# Patient Record
Sex: Male | Born: 1995 | Hispanic: No | Marital: Married | Smoking: Current every day smoker
Health system: Southern US, Community
[De-identification: ages and names within clinical notes are randomized; demographics above are authoritative.]

## PROBLEM LIST (undated history)

## (undated) DIAGNOSIS — I10 Essential (primary) hypertension: Secondary | ICD-10-CM

## (undated) DIAGNOSIS — Z789 Other specified health status: Secondary | ICD-10-CM

## (undated) HISTORY — PX: NO PAST SURGERIES: SHX2092

---

## 2021-07-07 ENCOUNTER — Emergency Department: Payer: Self-pay

## 2021-07-07 ENCOUNTER — Inpatient Hospital Stay
Admission: EM | Admit: 2021-07-07 | Discharge: 2021-07-11 | DRG: 200 | Disposition: A | Discharge status: Home / Self Care | Payer: Self-pay | Attending: Internal Medicine | Admitting: Internal Medicine

## 2021-07-07 ENCOUNTER — Other Ambulatory Visit: Payer: Self-pay

## 2021-07-07 ENCOUNTER — Encounter: Payer: Self-pay | Admitting: Emergency Medicine

## 2021-07-07 DIAGNOSIS — Z20822 Contact with and (suspected) exposure to covid-19: Secondary | ICD-10-CM | POA: Diagnosis present

## 2021-07-07 DIAGNOSIS — K5900 Constipation, unspecified: Secondary | ICD-10-CM

## 2021-07-07 DIAGNOSIS — R39198 Other difficulties with micturition: Secondary | ICD-10-CM

## 2021-07-07 DIAGNOSIS — F17210 Nicotine dependence, cigarettes, uncomplicated: Secondary | ICD-10-CM | POA: Diagnosis present

## 2021-07-07 DIAGNOSIS — Z23 Encounter for immunization: Secondary | ICD-10-CM

## 2021-07-07 DIAGNOSIS — D680 Von Willebrand's disease: Secondary | ICD-10-CM | POA: Diagnosis present

## 2021-07-07 DIAGNOSIS — J939 Pneumothorax, unspecified: Secondary | ICD-10-CM | POA: Diagnosis present

## 2021-07-07 DIAGNOSIS — R7301 Impaired fasting glucose: Secondary | ICD-10-CM | POA: Diagnosis present

## 2021-07-07 DIAGNOSIS — J9311 Primary spontaneous pneumothorax: Principal | ICD-10-CM | POA: Diagnosis present

## 2021-07-07 DIAGNOSIS — R011 Cardiac murmur, unspecified: Secondary | ICD-10-CM

## 2021-07-07 DIAGNOSIS — D68021 Von Willebrand disease, type 2b: Secondary | ICD-10-CM

## 2021-07-07 DIAGNOSIS — Z4682 Encounter for fitting and adjustment of non-vascular catheter: Secondary | ICD-10-CM

## 2021-07-07 DIAGNOSIS — Z720 Tobacco use: Secondary | ICD-10-CM | POA: Insufficient documentation

## 2021-07-07 DIAGNOSIS — D72829 Elevated white blood cell count, unspecified: Secondary | ICD-10-CM | POA: Insufficient documentation

## 2021-07-07 DIAGNOSIS — E876 Hypokalemia: Secondary | ICD-10-CM | POA: Diagnosis present

## 2021-07-07 HISTORY — DX: Other specified health status: Z78.9

## 2021-07-07 LAB — CBC
HCT: 39.6 % (ref 39.0–52.0)
Hemoglobin: 13.8 g/dL (ref 13.0–17.0)
MCH: 32.3 pg (ref 26.0–34.0)
MCHC: 34.8 g/dL (ref 30.0–36.0)
MCV: 92.7 fL (ref 80.0–100.0)
Platelets: 181 10*3/uL (ref 150–400)
RBC: 4.27 MIL/uL (ref 4.22–5.81)
RDW: 12.2 % (ref 11.5–15.5)
WBC: 11.3 10*3/uL — ABNORMAL HIGH (ref 4.0–10.5)
nRBC: 0 % (ref 0.0–0.2)

## 2021-07-07 LAB — BASIC METABOLIC PANEL
Anion gap: 6 (ref 5–15)
BUN: 15 mg/dL (ref 6–20)
CO2: 29 mmol/L (ref 22–32)
Calcium: 8.7 mg/dL — ABNORMAL LOW (ref 8.9–10.3)
Chloride: 101 mmol/L (ref 98–111)
Creatinine, Ser: 1.05 mg/dL (ref 0.61–1.24)
GFR, Estimated: >60 mL/min (ref >60)
Glucose, Bld: 157 mg/dL — ABNORMAL HIGH (ref 70–99)
Potassium: 3.4 mmol/L — ABNORMAL LOW (ref 3.5–5.1)
Sodium: 136 mmol/L (ref 135–145)

## 2021-07-07 LAB — TROPONIN I (HIGH SENSITIVITY): Troponin I (High Sensitivity): <2 ng/L (ref <18)

## 2021-07-07 LAB — RESP PANEL BY RT-PCR (FLU A&B, COVID) ARPGX2
Influenza A by PCR: NEGATIVE
Influenza B by PCR: NEGATIVE
SARS Coronavirus 2 by RT PCR: NEGATIVE

## 2021-07-07 MED ORDER — POTASSIUM CHLORIDE CRYS ER 20 MEQ PO TBCR
40.0000 meq | EXTENDED_RELEASE_TABLET | Freq: Once | ORAL | Status: AC
  Administered 2021-07-07: 40 meq via ORAL
  Filled 2021-07-07: qty 2

## 2021-07-07 MED ORDER — OXYCODONE HCL 5 MG PO TABS
5.0000 mg | ORAL_TABLET | ORAL | Status: DC | PRN
  Administered 2021-07-07 – 2021-07-09 (×7): 5 mg via ORAL
  Filled 2021-07-07 (×8): qty 1

## 2021-07-07 MED ORDER — LIDOCAINE HCL (PF) 1 % IJ SOLN
INTRAMUSCULAR | Status: AC
  Administered 2021-07-07: 10 mL via INTRADERMAL
  Filled 2021-07-07: qty 10

## 2021-07-07 MED ORDER — MORPHINE SULFATE (PF) 2 MG/ML IV SOLN
2.0000 mg | INTRAVENOUS | Status: DC | PRN
  Administered 2021-07-07 – 2021-07-09 (×6): 2 mg via INTRAVENOUS
  Filled 2021-07-07 (×6): qty 1

## 2021-07-07 MED ORDER — ONDANSETRON HCL 4 MG/2ML IJ SOLN
4.0000 mg | Freq: Four times a day (QID) | INTRAMUSCULAR | Status: DC | PRN
  Administered 2021-07-07: 4 mg via INTRAVENOUS
  Filled 2021-07-07 (×2): qty 2

## 2021-07-07 MED ORDER — PNEUMOCOCCAL VAC POLYVALENT 25 MCG/0.5ML IJ INJ
0.5000 mL | INJECTION | INTRAMUSCULAR | Status: AC
  Administered 2021-07-08: 0.5 mL via INTRAMUSCULAR
  Filled 2021-07-07: qty 0.5

## 2021-07-07 MED ORDER — LIDOCAINE HCL (PF) 1 % IJ SOLN
0.0000 mL | Freq: Once | INTRAMUSCULAR | Status: AC | PRN
  Filled 2021-07-07: qty 30

## 2021-07-07 MED ORDER — ACETAMINOPHEN 650 MG RE SUPP: 650.0000 mg | Freq: Four times a day (QID) | RECTAL | Status: DC | PRN

## 2021-07-07 MED ORDER — SODIUM CHLORIDE 0.9% FLUSH
10.0000 mL | Freq: Three times a day (TID) | INTRAVENOUS | Status: DC
  Administered 2021-07-07 – 2021-07-08 (×5): 10 mL

## 2021-07-07 MED ORDER — ACETAMINOPHEN 325 MG PO TABS
650.0000 mg | ORAL_TABLET | Freq: Four times a day (QID) | ORAL | Status: DC | PRN
  Administered 2021-07-07 – 2021-07-08 (×2): 650 mg via ORAL
  Filled 2021-07-07 (×2): qty 2

## 2021-07-07 MED ORDER — ONDANSETRON HCL 4 MG PO TABS: 4.0000 mg | ORAL_TABLET | Freq: Four times a day (QID) | ORAL | Status: DC | PRN

## 2021-07-07 MED ORDER — ENOXAPARIN SODIUM 40 MG/0.4ML IJ SOSY
40.0000 mg | PREFILLED_SYRINGE | INTRAMUSCULAR | Status: DC
  Administered 2021-07-08: 40 mg via SUBCUTANEOUS
  Filled 2021-07-07 (×2): qty 0.4

## 2021-07-07 MED ORDER — HYDROMORPHONE HCL 1 MG/ML IJ SOLN
0.5000 mg | Freq: Once | INTRAMUSCULAR | Status: AC
  Administered 2021-07-07: 0.5 mg via INTRAVENOUS
  Filled 2021-07-07: qty 1

## 2021-07-07 NOTE — Plan of Care (Signed)
  Problem: Education: Goal: Knowledge of General Education information will improve Description Including pain rating scale, medication(s)/side effects and non-pharmacologic comfort measures Outcome: Progressing   

## 2021-07-07 NOTE — ED Triage Notes (Signed)
Pt reports he woke up from his sleep with chest pain and shortness of breath, Pt reports pain increases with movement, when he coughs, pt reports pain radiates to rt arm and rt shoulder. Pt talks in complete sentences no distress noted.

## 2021-07-07 NOTE — ED Notes (Signed)
Informed RN bed assigned 

## 2021-07-07 NOTE — H&P (Signed)
[facility] [place] [facility]   PATIENT NAME: Wyatt Nelson    MR#:  [id number]  DATE OF BIRTH:  [birth year]  DATE OF ADMISSION:  07/07/2021  PRIMARY CARE PHYSICIAN: Pcp, No   REQUESTING/REFERRING PHYSICIAN: Dr Artis Delay  CHIEF COMPLAINT:   Chief Complaint  Patient presents with   Shortness of Breath   Chest Pain    HISTORY OF PRESENT ILLNESS:  Wyatt Nelson  is a 25 y.o. male coming in with chest pain and shortness of breath.  He was awakened from sleep with chest pain radiating up into his left shoulder associated with shortness of breath.  He was found to have a left-sided pneumothorax and ER physician placed a chest tube in.  He does smoke cigarettes 1 pack/day.  COVID test negative.  Hospitalist services were contacted for further evaluation.  PAST MEDICAL HISTORY:   Past Medical History:  Diagnosis Date   Medical history non-contributory     PAST SURGICAL HISTORY:   Past Surgical History:  Procedure Laterality Date   NO PAST SURGERIES      SOCIAL HISTORY:   Social History   Tobacco Use   Smoking status: Every Day    Packs/day: 1.00    Types: Cigarettes   Smokeless tobacco: Not on file  Substance Use Topics   Alcohol use: Not Currently    FAMILY HISTORY:   Family History  Problem Relation Age of Onset   Healthy Mother    Healthy Father     DRUG ALLERGIES:  No Known Allergies  REVIEW OF SYSTEMS:  CONSTITUTIONAL: No fever, fatigue or weakness.  Feels cold. EYES: No blurred or double vision.  EARS, NOSE, AND THROAT: No tinnitus or ear pain. No sore throat RESPIRATORY: Positive for cough and shortness of breath, no wheezing or hemoptysis.  CARDIOVASCULAR: Positive for chest pain  GASTROINTESTINAL: No nausea, vomiting, diarrhea or abdominal pain. No blood in bowel movements GENITOURINARY: No dysuria, hematuria.  ENDOCRINE: No polyuria, nocturia,  HEMATOLOGY: No anemia, easy bruising or bleeding SKIN: No rash or  lesion. MUSCULOSKELETAL: No joint pain or arthritis.   NEUROLOGIC: No tingling, numbness, weakness.  PSYCHIATRY: No anxiety or depression.   MEDICATIONS AT HOME:   Prior to Admission medications   Not on File   The patient does not take any medications  VITAL SIGNS:  Blood pressure 109/62, pulse 73, temperature 98 F (36.7 C), temperature source Oral, resp. rate 15, height 5\' 7"  (1.702 m), weight 65.8 kg, SpO2 96 %.  PHYSICAL EXAMINATION:  GENERAL:  25 y.o.-year-old patient lying in the bed with no acute distress.  EYES: Pupils equal, round, reactive to light and accommodation. No scleral icterus.  HEENT: Head atraumatic, normocephalic. Oropharynx and nasopharynx clear.  NECK:  Supple, no jugular venous distention. No thyroid enlargement, no tenderness.  LUNGS: Decreased breath sounds left base, no wheezing, rales,rhonchi or crepitation. No use of accessory muscles of respiration.  CARDIOVASCULAR: S1, S2 normal. No murmurs, rubs, or gallops.  ABDOMEN: Soft, nontender, nondistended. Bowel sounds present.  EXTREMITIES: No pedal edema, cyanosis, or clubbing.  NEUROLOGIC: Cranial nerves II through XII are intact. Muscle strength 5/5 in all extremities. Sensation intact. Gait not checked.  PSYCHIATRIC: The patient is alert and oriented x 3.  SKIN: No rash, lesion, or ulcer.   LABORATORY PANEL:   CBC Recent Labs  Lab 07/07/21 0616  WBC 11.3*  HGB 13.8  HCT 39.6  PLT 181   ------------------------------------------------------------------------------------------------------------------  Chemistries  Recent Labs  Lab 07/07/21 [phone]  NA 136  K 3.4*  CL 101  CO2 29  GLUCOSE 157*  BUN 15  CREATININE 1.05  CALCIUM 8.7*   ------------------------------------------------------------------------------------------------------------------   RADIOLOGY:  DG Chest 2 View  Result Date: 07/07/2021 CLINICAL DATA:  Chest pain and shortness of breath. EXAM: CHEST - 2 VIEW  COMPARISON:  None. FINDINGS: Midline trachea. Normal heart size and mediastinal contours. No pleural fluid. A large, approximately 50% left-sided pneumothorax identified superiorly and laterally. No mediastinal shift. Clear lungs. IMPRESSION: Large left-sided pneumothorax. Critical test results telephoned to .Dr. Elesa Massed. At the time of interpretation at 7:16 a.m.On 07/07/2021. Electronically Signed   By: Jeronimo Greaves M.D.   On: 07/07/2021 07:18   DG Chest Portable 1 View  Result Date: 07/07/2021 CLINICAL DATA:  Left-sided pneumothorax status post chest tube placement. EXAM: PORTABLE CHEST 1 VIEW COMPARISON:  Chest radiograph performed the same day. FINDINGS: The heart size and mediastinal contours are within normal limits. There has been interval placement of a left-sided thoracostomy tube with tip overlying the left mid lung. The left-sided pneumothorax has significantly decreased in size, now small in volume. There is mild left midlung atelectasis. The right lung is clear. There is no pleural effusion or right pneumothorax. The visualized skeletal structures are unremarkable. IMPRESSION: Interval decreased size of a left pneumothorax after chest tube placement, now small in volume. Electronically Signed   By: Romona Curls M.D.   On: 07/07/2021 09:22    EKG:   Normal sinus rhythm 70 bpm RSR prime.  IMPRESSION AND PLAN:   1.  Spontaneous pneumothorax in a smoker.  Chest tube placement by ER physician.  General surgery team to manage chest tube.  Will likely need a CT scan of the chest for further evaluation of the lungs during the hospital stay.  As needed oral and IV pain medication 2.  Tobacco abuse.  Refused nicotine patch. 3.  Leukocytosis likely secondary to pneumothorax 4.  Impaired fasting glucose check a hemoglobin A1c 5.  Hypokalemia.  Replace potassium orally 6.  Check Lyme titer    All the records, laboratory and radiological are reviewed and case discussed with ED provider. Management  plans discussed with the patient, family and they are in agreement.  Patient relates and creation criteria with pneumothorax and chest tube placement.  CODE STATUS: Full code  TOTAL TIME TAKING CARE OF THIS PATIENT: 45 minutes.    Alford Highland M.D on 07/07/2021 at 3:38 PM  Between 7am to 6pm - Pager - [phone]  After 6pm call admission pager [phone]  [facility]  CC: Primary care physician; Pcp, No

## 2021-07-07 NOTE — ED Provider Notes (Signed)
[facility] Emergency Department Provider Note  ____________________________________________   Event Date/Time   First MD Initiated Contact with Patient 07/07/21 [phone]     (approximate)  I have reviewed the triage vital signs and the nursing notes.   HISTORY  Chief Complaint Shortness of Breath and Chest Pain    HPI Wyatt Nelson is a 25 y.o. male otherwise healthy who comes in with chest pain.  Patient states that he woke up from sleep with chest pain and shortness of breath.  Reports pain is increased with movement when he coughs and the pain radiates into his right arm and right shoulder.  The pain is constant, has not take anything to help the pain.  Patient denies a history of this happening before.  Does smoke cigarettes for many years    History reviewed. No pertinent past medical history.  There are no problems to display for this patient.   History reviewed. No pertinent surgical history.  Prior to Admission medications   Not on File    Allergies Patient has no allergy information on record.  No family history on file.  Social History Positive smoking, Pt is married- speaks Arabic but declines Nurse, learning disability.  Wife is at bedside who speaks [place].    Review of Systems Constitutional: No fever/chills Eyes: No visual changes. ENT: No sore throat. Cardiovascular: Positive chest pain Respiratory: Positive shortness of breath Gastrointestinal: No abdominal pain.  No nausea, no vomiting.  No diarrhea.  No constipation. Genitourinary: Negative for dysuria. Musculoskeletal: Negative for back pain. Skin: Negative for rash. Neurological: Negative for headaches, focal weakness or numbness. All other ROS negative ____________________________________________   PHYSICAL EXAM:  VITAL SIGNS: ED Triage Vitals  Enc Vitals Group     BP 07/07/21 0608 (!) 103/53     Pulse Rate 07/07/21 0608 74     Resp 07/07/21 0608 20     Temp 07/07/21  0608 98 F (36.7 C)     Temp Source 07/07/21 0608 Oral     SpO2 07/07/21 0608 98 %     Weight 07/07/21 0611 145 lb (65.8 kg)     Height 07/07/21 0611 5\' 7"  (1.702 m)     Head Circumference --      Peak Flow --      Pain Score 07/07/21 0611 10     Pain Loc --      Pain Edu? --      Excl. in GC? --     Constitutional: Alert and oriented. Well appearing and in no acute distress. Eyes: Conjunctivae are normal. EOMI. Head: Atraumatic. Nose: No congestion/rhinnorhea. Mouth/Throat: Mucous membranes are moist.   Neck: No stridor. Trachea Midline. FROM Cardiovascular: Normal rate, regular rhythm. Grossly normal heart sounds.  Good peripheral circulation. Respiratory: Normal respiratory effort.  No retractions.  Decreased breath sounds on the left Gastrointestinal: Soft and nontender. No distention. No abdominal bruits.  Musculoskeletal: No lower extremity tenderness nor edema.  No joint effusions. Neurologic:  Normal speech and language. No gross focal neurologic deficits are appreciated.  Skin:  Skin is warm, dry and intact. No rash noted. Psychiatric: Mood and affect are normal. Speech and behavior are normal. GU: Deferred   ____________________________________________   LABS (all labs ordered are listed, but only abnormal results are displayed)  Labs Reviewed  BASIC METABOLIC PANEL - Abnormal; Notable for the following components:      Result Value   Potassium 3.4 (*)    Glucose, Bld 157 (*)  Calcium 8.7 (*)    All other components within normal limits  CBC - Abnormal; Notable for the following components:   WBC 11.3 (*)    All other components within normal limits  TROPONIN I (HIGH SENSITIVITY)   ____________________________________________   ED ECG REPORT I, Concha Se, the attending physician, personally viewed and interpreted this ECG.  Normal sinus rate of 70, no ST elevations, no T wave inversions, normal  intervals ____________________________________________  RADIOLOGY Vela Prose, personally viewed and evaluated these images (plain radiographs) as part of my medical decision making, as well as reviewing the written report by the radiologist.  ED MD interpretation: Large left-sided pneumothorax  Official radiology report(s): DG Chest 2 View  Result Date: 07/07/2021 CLINICAL DATA:  Chest pain and shortness of breath. EXAM: CHEST - 2 VIEW COMPARISON:  None. FINDINGS: Midline trachea. Normal heart size and mediastinal contours. No pleural fluid. A large, approximately 50% left-sided pneumothorax identified superiorly and laterally. No mediastinal shift. Clear lungs. IMPRESSION: Large left-sided pneumothorax. Critical test results telephoned to .Dr. Elesa Massed. At the time of interpretation at 7:16 a.m.On 07/07/2021. Electronically Signed   By: Jeronimo Greaves M.D.   On: 07/07/2021 07:18    ____________________________________________   PROCEDURES  Procedure(s) performed (including Critical Care):  .1-3 Lead EKG Interpretation  Date/Time: 07/07/2021 7:36 AM Performed by: Concha Se, MD Authorized by: Concha Se, MD     Interpretation: normal     ECG rate:  70s   ECG rate assessment: normal     Rhythm: sinus rhythm     Ectopy: none     Conduction: normal   CHEST TUBE INSERTION  Date/Time: 07/07/2021 9:16 AM Performed by: Concha Se, MD Authorized by: Concha Se, MD   Consent:    Consent obtained:  Verbal and written   Consent given by:  Patient and spouse   Risks, benefits, and alternatives were discussed: yes     Risks discussed:  Bleeding, damage to surrounding structures, infection, incomplete drainage, nerve damage and pain   Alternatives discussed:  No treatment Universal protocol:    Patient identity confirmed:  Verbally with patient and arm band Pre-procedure details:    Skin preparation:  Povidone-iodine Sedation:    Sedation type:  None Anesthesia:     Anesthesia method:  Local infiltration   Local anesthetic:  Lidocaine 1% w/o epi Procedure details:    Placement location:  L lateral   Scalpel size:  11   Tube size (Fr):  Minicatheter   Tube connected to:  Suction   Drainage characteristics:  Air only   Dressing:  4x4 sterile gauze and Xeroform gauze Post-procedure details:    Post-insertion x-ray findings: tube in good position     Procedure completion:  Tolerated well, no immediate complications   ____________________________________________   INITIAL IMPRESSION / ASSESSMENT AND PLAN / ED COURSE   Cherene Julian was evaluated in Emergency Department on 07/07/2021 for the symptoms described in the history of present illness. He was evaluated in the context of the global COVID-19 pandemic, which necessitated consideration that the patient might be at risk for infection with the SARS-CoV-2 virus that causes COVID-19. Institutional protocols and algorithms that pertain to the evaluation of patients at risk for COVID-19 are in a state of rapid change based on information released by regulatory bodies including the CDC and federal and state organizations. These policies and algorithms were followed during the patient's care in the ED.    Most Likely  DDx:  -MSK (atypical chest pain) but will get cardiac markers to evaluate for ACS given risk factors/age   DDx that was also considered d/t potential to cause harm, but was found less likely based on history and physical (as detailed above): -PNA (no fevers, cough but CXR to evaluate) -PNX (reassured with equal b/l breath sounds, CXR to evaluate) -Symptomatic anemia (will get H&H) -Pulmonary embolism as no sob at rest, not pleuritic in nature, no hypoxia -Aortic Dissection as no tearing pain and no radiation to the mid back, pulses equal -Pericarditis no rub on exam, EKG changes or hx to suggest dx -Tamponade (no notable SOB, tachycardic, hypotensive) -Esophageal rupture (no h/o diffuse  vomitting/no crepitus)   Patient has large pneumothorax.  Although patient does have large pneumothorax he is otherwise clinically stable with normal vital signs and oxygen levels above 90%.  We will keep on the cardiac monitor to evaluate for decompensation.  Small pneumothorax catheter was placed  Discussed the case with Dr. Lady Gary from surgery who recommended admission to the hospital team and they can consult on.  Repeat chest x-ray shows reexpansion of the lung       ____________________________________________   FINAL CLINICAL IMPRESSION(S) / ED DIAGNOSES   Final diagnoses:  Primary spontaneous pneumothorax     MEDICATIONS GIVEN DURING THIS VISIT:  Medications  sodium chloride flush (NS) 0.9 % injection 10 mL (10 mLs Intracatheter Given 07/07/21 0759)  lidocaine (PF) (XYLOCAINE) 1 % injection 0-30 mL (10 mLs Intradermal Given by Other 07/07/21 0836)  HYDROmorphone (DILAUDID) injection 0.5 mg (0.5 mg Intravenous Given 07/07/21 0759)     ED Discharge Orders     None        Note:  This document was prepared using Dragon voice recognition software and may include unintentional dictation errors.    Concha Se, MD 07/07/21 [phone]

## 2021-07-07 NOTE — Consult Note (Signed)
Reason for Consult: Spontaneous pneumothorax Referring Physician: Artis Delay, MD (emergency department)  Wyatt Nelson is an 25 y.o. male.  HPI: The patient was sleeping after his procedure and therefore the history was obtained from his wife who is at bedside and from the electronic medical record.  He is a 25 year old otherwise healthy male who came in with chest pain.  He was awakened from sleep with dyspnea and chest pain.  On presentation to the emergency department, he was found to have a large left-sided pneumothorax.  There was no tension component and his vital signs were stable.  He does have an extensive smoking history, having smoked cigarettes most of his life (he is from [place]).  His wife reports that he currently smokes about a pack a day and has never had a similar episode in the past.  A chest tube was placed by the emergency room provider.  The left lung is almost completely reexpanded on follow-up x-ray.  The patient is being admitted to hospital medicine for monitoring.  General surgery has been consulted for assistance in chest tube management.  History reviewed. No pertinent past medical history.  History reviewed. No pertinent surgical history.  No family history on file.  Social History:  has no history on file for tobacco use, alcohol use, and drug use.  Allergies: No Known Allergies  Medications: I have reviewed the patient's current medications.  Results for orders placed or performed during the hospital encounter of 07/07/21 (from the past 48 hour(s))  Basic metabolic panel     Status: Abnormal   Collection Time: 07/07/21  6:16 AM  Result Value Ref Range   Sodium 136 135 - 145 mmol/L   Potassium 3.4 (L) 3.5 - 5.1 mmol/L   Chloride 101 98 - 111 mmol/L   CO2 29 22 - 32 mmol/L   Glucose, Bld 157 (H) 70 - 99 mg/dL    Comment: Glucose reference range applies only to samples taken after fasting for at least 8 hours.   BUN 15 6 - 20 mg/dL   Creatinine, Ser 4.40 0.61  - 1.24 mg/dL   Calcium 8.7 (L) 8.9 - 10.3 mg/dL   GFR, Estimated >10 >27 mL/min    Comment: (NOTE) Calculated using the CKD-EPI Creatinine Equation (2021)    Anion gap 6 5 - 15    Comment: Performed at [facility], [address] [place], [place] [address]  CBC     Status: Abnormal   Collection Time: 07/07/21  6:16 AM  Result Value Ref Range   WBC 11.3 (H) 4.0 - 10.5 K/uL   RBC 4.27 4.22 - 5.81 MIL/uL   Hemoglobin 13.8 13.0 - 17.0 g/dL   HCT 44.0 34.7 - 42.5 %   MCV 92.7 80.0 - 100.0 fL   MCH 32.3 26.0 - 34.0 pg   MCHC 34.8 30.0 - 36.0 g/dL   RDW 95.6 38.7 - 56.4 %   Platelets 181 150 - 400 K/uL   nRBC 0.0 0.0 - 0.2 %    Comment: Performed at [facility], [address] [place], [place] [address]  Troponin I (High Sensitivity)     Status: None   Collection Time: 07/07/21  6:16 AM  Result Value Ref Range   Troponin I (High Sensitivity) <2 <18 ng/L    Comment: (NOTE) Elevated high sensitivity troponin I (hsTnI) values and significant  changes across serial measurements may suggest ACS but many other  chronic and acute conditions are known to elevate hsTnI  results.  Refer to the "Links" section for chest pain algorithms and additional  guidance. Performed at [facility], [address] [place], [place] [address]   Resp Panel by RT-PCR (Flu A&B, Covid) Nasopharyngeal Swab     Status: None   Collection Time: 07/07/21  9:54 AM   Specimen: Nasopharyngeal Swab; Nasopharyngeal(NP) swabs in vial transport medium  Result Value Ref Range   SARS Coronavirus 2 by RT PCR NEGATIVE NEGATIVE    Comment: (NOTE) SARS-CoV-2 target nucleic acids are NOT DETECTED.  The SARS-CoV-2 RNA is generally detectable in upper respiratory specimens during the acute phase of infection. The lowest concentration of SARS-CoV-2 viral copies this assay can detect is 138 copies/mL. A negative result does not preclude SARS-Cov-2 infection and should not be used as the sole  basis for treatment or other patient management decisions. A negative result may occur with  improper specimen collection/handling, submission of specimen other than nasopharyngeal swab, presence of viral mutation(s) within the areas targeted by this assay, and inadequate number of viral copies(<138 copies/mL). A negative result must be combined with clinical observations, patient history, and epidemiological information. The expected result is Negative.  Fact Sheet for Patients:  BloggerCourse.com  Fact Sheet for Healthcare Providers:  SeriousBroker.it  This test is no t yet approved or cleared by the [place] FDA and  has been authorized for detection and/or diagnosis of SARS-CoV-2 by FDA under an Emergency Use Authorization (EUA). This EUA will remain  in effect (meaning this test can be used) for the duration of the COVID-19 declaration under Section 564(b)(1) of the Act, 21 U.S.C.section 360bbb-3(b)(1), unless the authorization is terminated  or revoked sooner.       Influenza A by PCR NEGATIVE NEGATIVE   Influenza B by PCR NEGATIVE NEGATIVE    Comment: (NOTE) The Xpert Xpress SARS-CoV-2/FLU/RSV plus assay is intended as an aid in the diagnosis of influenza from Nasopharyngeal swab specimens and should not be used as a sole basis for treatment. Nasal washings and aspirates are unacceptable for Xpert Xpress SARS-CoV-2/FLU/RSV testing.  Fact Sheet for Patients: BloggerCourse.com  Fact Sheet for Healthcare Providers: SeriousBroker.it  This test is not yet approved or cleared by the [place] FDA and has been authorized for detection and/or diagnosis of SARS-CoV-2 by FDA under an Emergency Use Authorization (EUA). This EUA will remain in effect (meaning this test can be used) for the duration of the COVID-19 declaration under Section 564(b)(1) of the Act, 21  U.S.C. section 360bbb-3(b)(1), unless the authorization is terminated or revoked.  Performed at [facility], [address] [place], [place] [address]     DG Chest 2 View  Result Date: 07/07/2021 CLINICAL DATA:  Chest pain and shortness of breath. EXAM: CHEST - 2 VIEW COMPARISON:  None. FINDINGS: Midline trachea. Normal heart size and mediastinal contours. No pleural fluid. A large, approximately 50% left-sided pneumothorax identified superiorly and laterally. No mediastinal shift. Clear lungs. IMPRESSION: Large left-sided pneumothorax. Critical test results telephoned to .Dr. Elesa Massed. At the time of interpretation at 7:16 a.m.On 07/07/2021. Electronically Signed   By: Jeronimo Greaves M.D.   On: 07/07/2021 07:18   DG Chest Portable 1 View  Result Date: 07/07/2021 CLINICAL DATA:  Left-sided pneumothorax status post chest tube placement. EXAM: PORTABLE CHEST 1 VIEW COMPARISON:  Chest radiograph performed the same day. FINDINGS: The heart size and mediastinal contours are within normal limits. There has been interval placement of a left-sided thoracostomy tube with tip overlying the left mid  lung. The left-sided pneumothorax has significantly decreased in size, now small in volume. There is mild left midlung atelectasis. The right lung is clear. There is no pleural effusion or right pneumothorax. The visualized skeletal structures are unremarkable. IMPRESSION: Interval decreased size of a left pneumothorax after chest tube placement, now small in volume. Electronically Signed   By: Romona Curls M.D.   On: 07/07/2021 09:22    Review of Systems  Respiratory:  Positive for shortness of breath.   Cardiovascular:  Positive for chest pain.  All other systems reviewed and are negative. Blood pressure 99/68, pulse 70, temperature 98 F (36.7 C), temperature source Oral, resp. rate 13, height 5\' 7"  (1.702 m), weight 65.8 kg, SpO2 99 %. Body mass index is 22.71 kg/m.  Physical Exam Vitals  reviewed.  Constitutional:      Comments: Sleeping Middle male in no acute distress.  HENT:     Head: Normocephalic and atraumatic.     Nose: Nose normal.     Mouth/Throat:     Mouth: Mucous membranes are moist.  Eyes:     General:        Right eye: No discharge.        Left eye: No discharge.  Cardiovascular:     Rate and Rhythm: Normal rate and regular rhythm.  Pulmonary:     Effort: Pulmonary effort is normal. No respiratory distress.     Comments: Chest tube in the left side.  No air leak apparent. Abdominal:     Palpations: Abdomen is soft.  Genitourinary:    Comments: Deferred Musculoskeletal:        General: No swelling or tenderness.  Skin:    General: Skin is warm and dry.  Neurological:     General: No focal deficit present.    Assessment/Plan: This is a 25 year old male with a long smoking history who presented to the emergency department with a spontaneous pneumothorax.  The pneumothorax has almost completely resolved with chest tube placement.  Continue chest tube to suction for 24 hours.  I will order repeat chest x-ray for the morning.  If lung remains inflated, we can put him to waterseal and consider removal.  Despite his young age, given his considerable smoking history, he may already be developing emphysematous lung changes and he may need further follow-up and evaluation in the future.  34 07/07/2021, 12:28 PM

## 2021-07-08 ENCOUNTER — Inpatient Hospital Stay: Payer: Self-pay

## 2021-07-08 ENCOUNTER — Encounter: Payer: Self-pay | Admitting: Internal Medicine

## 2021-07-08 DIAGNOSIS — D68021 Von Willebrand disease, type 2b: Secondary | ICD-10-CM

## 2021-07-08 DIAGNOSIS — D680 Von Willebrand's disease: Secondary | ICD-10-CM

## 2021-07-08 LAB — CBC
HCT: 42.8 % (ref 39.0–52.0)
Hemoglobin: 14.2 g/dL (ref 13.0–17.0)
MCH: 30.5 pg (ref 26.0–34.0)
MCHC: 33.2 g/dL (ref 30.0–36.0)
MCV: 92 fL (ref 80.0–100.0)
Platelets: 197 10*3/uL (ref 150–400)
RBC: 4.65 MIL/uL (ref 4.22–5.81)
RDW: 12.5 % (ref 11.5–15.5)
WBC: 7.7 10*3/uL (ref 4.0–10.5)
nRBC: 0 % (ref 0.0–0.2)

## 2021-07-08 LAB — BASIC METABOLIC PANEL
Anion gap: 6 (ref 5–15)
BUN: 12 mg/dL (ref 6–20)
CO2: 28 mmol/L (ref 22–32)
Calcium: 8.9 mg/dL (ref 8.9–10.3)
Chloride: 104 mmol/L (ref 98–111)
Creatinine, Ser: 0.7 mg/dL (ref 0.61–1.24)
GFR, Estimated: >60 mL/min (ref >60)
Glucose, Bld: 110 mg/dL — ABNORMAL HIGH (ref 70–99)
Potassium: 3.7 mmol/L (ref 3.5–5.1)
Sodium: 138 mmol/L (ref 135–145)

## 2021-07-08 LAB — HEMOGLOBIN A1C
Hgb A1c MFr Bld: 5.3 % (ref 4.8–5.6)
Mean Plasma Glucose: 105.41 mg/dL

## 2021-07-08 LAB — HIV ANTIBODY (ROUTINE TESTING W REFLEX): HIV Screen 4th Generation wRfx: NONREACTIVE

## 2021-07-08 MED ORDER — POLYETHYLENE GLYCOL 3350 17 G PO PACK
17.0000 g | PACK | Freq: Every day | ORAL | Status: DC
  Administered 2021-07-08: 17 g via ORAL
  Filled 2021-07-08 (×2): qty 1

## 2021-07-08 MED ORDER — SENNA 8.6 MG PO TABS
1.0000 | ORAL_TABLET | Freq: Every day | ORAL | Status: DC
  Administered 2021-07-08 – 2021-07-11 (×4): 8.6 mg via ORAL
  Filled 2021-07-08 (×4): qty 1

## 2021-07-08 NOTE — Progress Notes (Signed)
With chest tube on waterseal, roughly 10% pneumothorax reaccumulated.  Will return to suction and recheck chest x-ray in the morning.

## 2021-07-08 NOTE — Progress Notes (Signed)
Patient ID: Wyatt Nelson, male   DOB: 14-Jul-1996, 25 y.o.   MRN: [id number] [facility] PROGRESS NOTE  Wyatt Nelson EZM:[id number] DOB: June 22, 1996 DOA: 07/07/2021 PCP: Pcp, No  HPI/Subjective: Patient admitted with spontaneous pneumothorax requiring chest tube.  Patient with less pain today than yesterday.  Still has difficulty taking deep breath.  Slight cough.  History of smoking.  Objective: Vitals:   07/08/21 0409 07/08/21 1012  BP: 98/61 (!) 97/56  Pulse: 72 62  Resp: 16 18  Temp: 97.9 F (36.6 C) (!) 97.4 F (36.3 C)  SpO2: 98% 99%    Intake/Output Summary (Last 24 hours) at 07/08/2021 1302 Last data filed at 07/08/2021 5035 Gross per 24 hour  Intake 130 ml  Output 21 ml  Net 109 ml   Filed Weights   07/07/21 0611  Weight: 65.8 kg    ROS: Review of Systems  Respiratory:  Positive for cough and shortness of breath.   Cardiovascular:  Positive for chest pain.  Gastrointestinal:  Positive for constipation. Negative for abdominal pain.  Exam: Physical Exam HENT:     Head: Normocephalic.     Mouth/Throat:     Pharynx: No oropharyngeal exudate.  Eyes:     General: Lids are normal.     Conjunctiva/sclera: Conjunctivae normal.  Cardiovascular:     Rate and Rhythm: Normal rate and regular rhythm.     Heart sounds: Normal heart sounds, S1 normal and S2 normal.  Pulmonary:     Breath sounds: Examination of the left-lower field reveals decreased breath sounds. Decreased breath sounds present. No wheezing, rhonchi or rales.  Abdominal:     Palpations: Abdomen is soft.     Tenderness: There is no abdominal tenderness.  Musculoskeletal:     Right ankle: No swelling.     Left ankle: No swelling.  Skin:    General: Skin is warm.     Findings: No rash.  Neurological:     Mental Status: He is alert.      Scheduled Meds:  enoxaparin (LOVENOX) injection  40 mg Subcutaneous Q24H   polyethylene glycol  17 g Oral Daily   senna  1 tablet Oral Daily   sodium  chloride flush  10 mL Intracatheter Q8H     Assessment/Plan:  Spontaneous pneumothorax status post chest tube placement by ER physician.  General surgery team to manage chest tube. Tobacco abuse.  Refused nicotine patch.  Patient must stop smoking with having a pneumothorax. Leukocytosis likely reactive Impaired fasting glucose.  Patient is not a diabetic Hypokalemia replaced    Code Status:     Code Status Orders  (From admission, onward)           Start     Ordered   07/07/21 0935  Full code  Continuous        07/07/21 0934           Code Status History     This patient has a current code status but no historical code status.      Family Communication: Family at bedside Disposition Plan: Status is: Inpatient  Dispo: The patient is from: Home              Anticipated d/c is to: Home              Patient currently has chest tube in and can go home with a chest tube   Difficult to place patient.  No.  Consultants: General surgery  Procedures: Chest tube placement in  ER  Time spent: 25 minutes  Talys[employer]

## 2021-07-08 NOTE — Consult Note (Signed)
Reason for Consult: Spontaneous pneumothorax Referring Physician: Artis Delay, MD (emergency department)  Wyatt Nelson is an 25 y.o. male.  HPI: The patient was sleeping after his procedure and therefore the history was obtained from his wife who is at bedside and from the electronic medical record.  He is a 25 year old otherwise healthy male who came in with chest pain.  He was awakened from sleep with dyspnea and chest pain.  On presentation to the emergency department, he was found to have a large left-sided pneumothorax.  There was no tension component and his vital signs were stable.  He does have an extensive smoking history, having smoked cigarettes most of his life (he is from [place]).  His wife reports that he currently smokes about a pack a day and has never had a similar episode in the past.  A chest tube was placed by the emergency room provider.  The left lung is almost completely reexpanded on follow-up x-ray.  The patient is being admitted to hospital medicine for monitoring.  General surgery has been consulted for assistance in chest tube management.  INTERVAL HISTORY 07/08/21: More alert today.  Difficult to take a deep breath, but not feeling SOB. Pain at CT site.  CXR shows tiny trace PTX.  Past Medical History:  Diagnosis Date   Medical history non-contributory     Past Surgical History:  Procedure Laterality Date   NO PAST SURGERIES      Family History  Problem Relation Age of Onset   Healthy Mother    Healthy Father     Social History:  reports that he has been smoking cigarettes. He has been smoking an average of 1 pack per day. He has never used smokeless tobacco. He reports that he does not currently use alcohol. He reports that he does not use drugs.  Allergies: No Known Allergies  Medications: I have reviewed the patient's current medications.  Results for orders placed or performed during the hospital encounter of 07/07/21 (from the past 48 hour(s))  Basic  metabolic panel     Status: Abnormal   Collection Time: 07/07/21  6:16 AM  Result Value Ref Range   Sodium 136 135 - 145 mmol/L   Potassium 3.4 (L) 3.5 - 5.1 mmol/L   Chloride 101 98 - 111 mmol/L   CO2 29 22 - 32 mmol/L   Glucose, Bld 157 (H) 70 - 99 mg/dL    Comment: Glucose reference range applies only to samples taken after fasting for at least 8 hours.   BUN 15 6 - 20 mg/dL   Creatinine, Ser 6.62 0.61 - 1.24 mg/dL   Calcium 8.7 (L) 8.9 - 10.3 mg/dL   GFR, Estimated >94 >76 mL/min    Comment: (NOTE) Calculated using the CKD-EPI Creatinine Equation (2021)    Anion gap 6 5 - 15    Comment: Performed at [facility], [address] [place], [place] [address]  CBC     Status: Abnormal   Collection Time: 07/07/21  6:16 AM  Result Value Ref Range   WBC 11.3 (H) 4.0 - 10.5 K/uL   RBC 4.27 4.22 - 5.81 MIL/uL   Hemoglobin 13.8 13.0 - 17.0 g/dL   HCT 35.4 65.6 - 81.2 %   MCV 92.7 80.0 - 100.0 fL   MCH 32.3 26.0 - 34.0 pg   MCHC 34.8 30.0 - 36.0 g/dL   RDW 75.1 70.0 - 17.4 %   Platelets 181 150 - 400 K/uL   nRBC 0.0  0.0 - 0.2 %    Comment: Performed at [facility], [address] [place], [place] [address]  Troponin I (High Sensitivity)     Status: None   Collection Time: 07/07/21  6:16 AM  Result Value Ref Range   Troponin I (High Sensitivity) <2 <18 ng/L    Comment: (NOTE) Elevated high sensitivity troponin I (hsTnI) values and significant  changes across serial measurements may suggest ACS but many other  chronic and acute conditions are known to elevate hsTnI results.  Refer to the "Links" section for chest pain algorithms and additional  guidance. Performed at [facility], [address] [place], [place] [address]   Resp Panel by RT-PCR (Flu A&B, Covid) Nasopharyngeal Swab     Status: None   Collection Time: 07/07/21  9:54 AM   Specimen: Nasopharyngeal Swab; Nasopharyngeal(NP) swabs in vial transport medium  Result Value Ref Range    SARS Coronavirus 2 by RT PCR NEGATIVE NEGATIVE    Comment: (NOTE) SARS-CoV-2 target nucleic acids are NOT DETECTED.  The SARS-CoV-2 RNA is generally detectable in upper respiratory specimens during the acute phase of infection. The lowest concentration of SARS-CoV-2 viral copies this assay can detect is 138 copies/mL. A negative result does not preclude SARS-Cov-2 infection and should not be used as the sole basis for treatment or other patient management decisions. A negative result may occur with  improper specimen collection/handling, submission of specimen other than nasopharyngeal swab, presence of viral mutation(s) within the areas targeted by this assay, and inadequate number of viral copies(<138 copies/mL). A negative result must be combined with clinical observations, patient history, and epidemiological information. The expected result is Negative.  Fact Sheet for Patients:  BloggerCourse.com  Fact Sheet for Healthcare Providers:  SeriousBroker.it  This test is no t yet approved or cleared by the [place] FDA and  has been authorized for detection and/or diagnosis of SARS-CoV-2 by FDA under an Emergency Use Authorization (EUA). This EUA will remain  in effect (meaning this test can be used) for the duration of the COVID-19 declaration under Section 564(b)(1) of the Act, 21 U.S.C.section 360bbb-3(b)(1), unless the authorization is terminated  or revoked sooner.       Influenza A by PCR NEGATIVE NEGATIVE   Influenza B by PCR NEGATIVE NEGATIVE    Comment: (NOTE) The Xpert Xpress SARS-CoV-2/FLU/RSV plus assay is intended as an aid in the diagnosis of influenza from Nasopharyngeal swab specimens and should not be used as a sole basis for treatment. Nasal washings and aspirates are unacceptable for Xpert Xpress SARS-CoV-2/FLU/RSV testing.  Fact Sheet for Patients: BloggerCourse.com  Fact  Sheet for Healthcare Providers: SeriousBroker.it  This test is not yet approved or cleared by the [place] FDA and has been authorized for detection and/or diagnosis of SARS-CoV-2 by FDA under an Emergency Use Authorization (EUA). This EUA will remain in effect (meaning this test can be used) for the duration of the COVID-19 declaration under Section 564(b)(1) of the Act, 21 U.S.C. section 360bbb-3(b)(1), unless the authorization is terminated or revoked.  Performed at [facility], [address] [place], [place] [address]   Hemoglobin A1c     Status: None   Collection Time: 07/07/21  3:48 PM  Result Value Ref Range   Hgb A1c MFr Bld 5.3 4.8 - 5.6 %    Comment: (NOTE) Pre diabetes:          5.7%-6.4%  Diabetes:              >  6.4%  Glycemic control for   <7.0% adults with diabetes    Mean Plasma Glucose 105.41 mg/dL    Comment: Performed at [facility], [address] [place], [place] [address]  HIV Antibody (routine testing w rflx)     Status: None   Collection Time: 07/08/21  5:16 AM  Result Value Ref Range   HIV Screen 4th Generation wRfx Non Reactive Non Reactive    Comment: Performed at [facility], [address] [place], [place] [address]  Basic metabolic panel     Status: Abnormal   Collection Time: 07/08/21  5:16 AM  Result Value Ref Range   Sodium 138 135 - 145 mmol/L   Potassium 3.7 3.5 - 5.1 mmol/L   Chloride 104 98 - 111 mmol/L   CO2 28 22 - 32 mmol/L   Glucose, Bld 110 (H) 70 - 99 mg/dL    Comment: Glucose reference range applies only to samples taken after fasting for at least 8 hours.   BUN 12 6 - 20 mg/dL   Creatinine, Ser 3.71 0.61 - 1.24 mg/dL   Calcium 8.9 8.9 - 69.6 mg/dL   GFR, Estimated >78 >93 mL/min    Comment: (NOTE) Calculated using the CKD-EPI Creatinine Equation (2021)    Anion gap 6 5 - 15    Comment: Performed at [facility], [address] [place], [place] [address]   CBC     Status: None   Collection Time: 07/08/21  5:16 AM  Result Value Ref Range   WBC 7.7 4.0 - 10.5 K/uL   RBC 4.65 4.22 - 5.81 MIL/uL   Hemoglobin 14.2 13.0 - 17.0 g/dL   HCT 51.0 25.8 - 52.7 %   MCV 92.0 80.0 - 100.0 fL   MCH 30.5 26.0 - 34.0 pg   MCHC 33.2 30.0 - 36.0 g/dL   RDW 78.2 42.3 - 53.6 %   Platelets 197 150 - 400 K/uL   nRBC 0.0 0.0 - 0.2 %    Comment: Performed at [facility], [address] [place], [place] [address]    DG Chest 1 View  Result Date: 07/08/2021 CLINICAL DATA:  Pneumothorax EXAM: CHEST  1 VIEW COMPARISON:  07/07/2021 FINDINGS: Left chest catheter tip projects at the level of the posterior fourth rib. Small residual pneumothorax is unchanged. Right lung is normal IMPRESSION: Unchanged small residual left pneumothorax. Electronically Signed   By: Deatra Robinson M.D.   On: 07/08/2021 04:01   DG Chest 2 View  Result Date: 07/07/2021 CLINICAL DATA:  Chest pain and shortness of breath. EXAM: CHEST - 2 VIEW COMPARISON:  None. FINDINGS: Midline trachea. Normal heart size and mediastinal contours. No pleural fluid. A large, approximately 50% left-sided pneumothorax identified superiorly and laterally. No mediastinal shift. Clear lungs. IMPRESSION: Large left-sided pneumothorax. Critical test results telephoned to .Dr. Elesa Massed. At the time of interpretation at 7:16 a.m.On 07/07/2021. Electronically Signed   By: Jeronimo Greaves M.D.   On: 07/07/2021 07:18   DG Chest Portable 1 View  Result Date: 07/07/2021 CLINICAL DATA:  Left-sided pneumothorax status post chest tube placement. EXAM: PORTABLE CHEST 1 VIEW COMPARISON:  Chest radiograph performed the same day. FINDINGS: The heart size and mediastinal contours are within normal limits. There has been interval placement of a left-sided thoracostomy tube with tip overlying the left mid lung. The left-sided pneumothorax has significantly decreased in size, now small in volume. There is mild left midlung atelectasis.  The right lung is  clear. There is no pleural effusion or right pneumothorax. The visualized skeletal structures are unremarkable. IMPRESSION: Interval decreased size of a left pneumothorax after chest tube placement, now small in volume. Electronically Signed   By: Romona Curlsyler  Litton M.D.   On: 07/07/2021 09:22    Review of Systems  Respiratory:  Negative for shortness of breath.   Cardiovascular:  Positive for chest pain.  All other systems reviewed and are negative. Blood pressure (!) 97/56, pulse 62, temperature (!) 97.4 F (36.3 C), temperature source Oral, resp. rate 18, height 5\' 7"  (1.702 m), weight 65.8 kg, SpO2 99 %. Body mass index is 22.71 kg/m.  Physical Exam Vitals reviewed.  Constitutional:      General: He is not in acute distress.    Appearance: He is normal weight.  HENT:     Head: Normocephalic and atraumatic.     Nose: Nose normal.     Mouth/Throat:     Mouth: Mucous membranes are moist.  Eyes:     General:        Right eye: No discharge.        Left eye: No discharge.  Cardiovascular:     Rate and Rhythm: Normal rate and regular rhythm.  Pulmonary:     Effort: Pulmonary effort is normal. No respiratory distress.     Comments: Chest tube in the left side.  No air leak with cough. Abdominal:     Palpations: Abdomen is soft.  Genitourinary:    Comments: Deferred Musculoskeletal:        General: No swelling or tenderness.  Skin:    General: Skin is warm and dry.  Neurological:     General: No focal deficit present.     Mental Status: He is alert.    Assessment/Plan: This is a 25 year old male with a long smoking history who presented to the emergency department with a spontaneous pneumothorax.  The pneumothorax has almost completely resolved with chest tube placement.  Will put tube to waterseal and check repeat CXR this evening.  Despite his young age, given his considerable smoking history, he may already be developing emphysematous lung changes and he may need  further follow-up and evaluation in the future.  Duanne GuessJennifer Dyanna Seiter 07/08/2021, 2:30 PM

## 2021-07-08 NOTE — Progress Notes (Signed)
FYI While ambulating with patient in hall, patient expressed concern that his wife had left her White long rectangular wallet as described by him, in room 5 at the ED last night when patient was transferred to room 228.  Care RN went to ED and spoke with Charge RN to look into it to see if anyone had seen it.

## 2021-07-08 NOTE — Plan of Care (Signed)

## 2021-07-09 ENCOUNTER — Inpatient Hospital Stay: Payer: Self-pay

## 2021-07-09 DIAGNOSIS — K5900 Constipation, unspecified: Secondary | ICD-10-CM

## 2021-07-09 MED ORDER — OXYCODONE HCL 5 MG PO TABS
5.0000 mg | ORAL_TABLET | ORAL | Status: DC | PRN
  Administered 2021-07-09: 10 mg via ORAL
  Administered 2021-07-09: 5 mg via ORAL
  Administered 2021-07-09 – 2021-07-11 (×10): 10 mg via ORAL
  Filled 2021-07-09: qty 2
  Filled 2021-07-09: qty 1
  Filled 2021-07-09 (×5): qty 2
  Filled 2021-07-09: qty 1
  Filled 2021-07-09 (×4): qty 2

## 2021-07-09 MED ORDER — MORPHINE SULFATE (PF) 2 MG/ML IV SOLN
2.0000 mg | INTRAVENOUS | Status: DC | PRN
  Administered 2021-07-09: 2 mg via INTRAVENOUS
  Administered 2021-07-09: 4 mg via INTRAVENOUS
  Administered 2021-07-09: 2 mg via INTRAVENOUS
  Administered 2021-07-10: 4 mg via INTRAVENOUS
  Administered 2021-07-10 (×2): 2 mg via INTRAVENOUS
  Administered 2021-07-11 (×2): 4 mg via INTRAVENOUS
  Filled 2021-07-09: qty 2
  Filled 2021-07-09: qty 1
  Filled 2021-07-09 (×3): qty 2
  Filled 2021-07-09 (×3): qty 1

## 2021-07-09 NOTE — Progress Notes (Signed)
[facility] SURGICAL PROGRESS NOTE (cpt [id number])  Hospital Day(s): 2.   Interval History: Patient seen and examined, no acute events or new complaints overnight. Patient reports continues to left upper chest and back pain as well as at chest tube site. He does appear somewhat short of breath from this. No fever, chills, cough. Despite CXR showing left pneumothorax last night on water seal, and order to return to suction, this was not done and patient was left on water seal overnight. CXR this morning shows slightly worse left pneumothorax.   Review of Systems:  Constitutional: denies fever, chills  HEENT: denies cough or congestion  Respiratory: + shortness of breath  Cardiovascular: + chest pain Gastrointestinal: denies abdominal pain, N/V, or diarrhea/and bowel function as per interval history Genitourinary: denies burning with urination or urinary frequency  Vital signs in last 24 hours: [min-max] current  Temp:  [97.4 F (36.3 C)-98.4 F (36.9 C)] 98.4 F (36.9 C) (08/22 0421) Pulse Rate:  [62-82] 82 (08/22 0421) Resp:  [15-18] 16 (08/22 0421) BP: (97-123)/(56-106) 102/63 (08/22 0421) SpO2:  [97 %-99 %] 97 % (08/22 0421)     Height: 5\' 7"  (170.2 cm) Weight: 65.8 kg BMI (Calculated): 22.71   Intake/Output last 2 shifts:  08/21 0701 - 08/22 0700 In: -  Out: 5 [Chest Tube:5]   Physical Exam:  Constitutional: alert, cooperative and no distress, appears uncomfortable HENT: normocephalic without obvious abnormality  Eyes: PERRL, EOM's grossly intact and symmetric  Respiratory: short shallow breathing, no distress  Cardiovascular: regular rate and sinus rhythm  Chest: Chest tube to the left lateral chest, no appreciable air leak, site is CDI Musculoskeletal: no edema or wounds, motor and sensation grossly intact, NT    Labs:  CBC Latest Ref Rng & Units 07/08/2021 07/07/2021  WBC 4.0 - 10.5 K/uL 7.7 11.3(H)  Hemoglobin 13.0 - 17.0 g/dL 07/09/2021 25.8  Hematocrit 52.7 -  52.0 % 42.8 39.6  Platelets 150 - 400 K/uL 197 181   CMP Latest Ref Rng & Units 07/08/2021 07/07/2021  Glucose 70 - 99 mg/dL 07/09/2021) 423(N)  BUN 6 - 20 mg/dL 12 15  Creatinine 361(W - 1.24 mg/dL 4.31 5.40  Sodium 0.86 - 145 mmol/L 138 136  Potassium 3.5 - 5.1 mmol/L 3.7 3.4(L)  Chloride 98 - 111 mmol/L 104 101  CO2 22 - 32 mmol/L 28 29  Calcium 8.9 - 10.3 mg/dL 8.9 761)     Imaging studies:   CXR (07/09/2021) personally reviewed with similar to slightly worse left pneumothorax, chest tube in stable position, and radiologist report pending   Assessment/Plan: (ICD-10's: J93.11) 25 y.o. male with persistent spontaneous left pneumothorax   - Unfortunately, his chest tube was left on water seal overnight and he has similar to worse left sided pneumothorax with pain/SOB. As such, I returned his chest tube to -20 cm of suction this morning. I had immediate return of air bubbles which then ceased. No obvious air leak appreciable; however, patient with weak cough. Recommend leaving to suction x24 hours and repeating CXR in the AM to ensure lung is inflated before water seal trial.    - Okay to remove suction to ambulate, return to suction in room  - I did adjust pain medications this morning   - Added incentive spirometer - Further management per primary service; we will follow    All of the above findings and recommendations were discussed with the patient, patient's family (wife at bedside), and the medical team, and all of  patient's and family's questions were answered to their expressed satisfaction.  -- Lynden Oxford, PA-C [facility] 07/09/2021, 7:18 AM [phone] M-F: 7am - 4pm

## 2021-07-09 NOTE — Progress Notes (Signed)
Patient ID: Wyatt Nelson, male   DOB: 10/27/1996, 25 y.o.   MRN: [id number] [facility] PROGRESS NOTE  Wyatt Nelson [id number] DOB: 1996-06-21 DOA: 07/07/2021 PCP: Pcp, No  HPI/Subjective: Patient still having a lot of chest pain on the left side.  Difficulty taking a deep breath.  Pain with respiration.  Still with some constipation  Objective: Vitals:   07/09/21 0841 07/09/21 1532  BP: 120/65 124/65  Pulse: 81 84  Resp: (!) 22 18  Temp: 98.5 F (36.9 C) 98.5 F (36.9 C)  SpO2: 98% 96%    Filed Weights   07/07/21 0611  Weight: 65.8 kg    ROS: Review of Systems  Respiratory:  Positive for cough and shortness of breath.   Cardiovascular:  Positive for chest pain.  Gastrointestinal:  Positive for constipation. Negative for abdominal pain, nausea and vomiting.  Exam: Physical Exam HENT:     Head: Normocephalic.     Mouth/Throat:     Pharynx: No oropharyngeal exudate.  Eyes:     General: Lids are normal.     Conjunctiva/sclera: Conjunctivae normal.     Pupils: Pupils are equal, round, and reactive to light.  Cardiovascular:     Rate and Rhythm: Normal rate and regular rhythm.     Heart sounds: S1 normal and S2 normal. Murmur heard.  Systolic murmur is present with a grade of 2/6.  Pulmonary:     Breath sounds: Examination of the left-lower field reveals decreased breath sounds. Decreased breath sounds present. No wheezing, rhonchi or rales.  Abdominal:     Palpations: Abdomen is soft.     Tenderness: There is no abdominal tenderness.  Musculoskeletal:     Right lower leg: No swelling.     Left lower leg: No swelling.  Skin:    General: Skin is warm.     Findings: No rash.  Neurological:     Mental Status: He is alert and oriented to person, place, and time.      Scheduled Meds:  enoxaparin (LOVENOX) injection  40 mg Subcutaneous Q24H   polyethylene glycol  17 g Oral Daily   senna  1 tablet Oral Daily    Assessment/Plan:  Spontaneous pneumothorax  status post chest tube placement in the ER.  General surgery following.  Continue suction as per general surgery. Heart murmur we will get an echocardiogram. Tobacco abuse.  Patient must stop smoking Leukocytosis reactive in nature.  White count normalized the next day Impaired fasting glucose Hypokalemia replaced Constipation senna and MiraLAX      Code Status:     Code Status Orders  (From admission, onward)           Start     Ordered   07/07/21 0935  Full code  Continuous        07/07/21 0934           Code Status History     This patient has a current code status but no historical code status.      Family Communication: At bedside Disposition Plan: Status is: Inpatient  Dispo: The patient is from: Home              Anticipated d/c is to: Home              Patient currently still has chest tube in, unable to go home with chest tube.   Difficult to place patient.  No.  Consultants: General surgery  Procedures: Chest tube  Time spent: 26 minutes  [place]  [facility] [employer]

## 2021-07-10 ENCOUNTER — Inpatient Hospital Stay
Admit: 2021-07-10 | Discharge: 2021-07-10 | Disposition: A | Discharge status: Home / Self Care | Payer: Self-pay | Attending: Internal Medicine | Admitting: Internal Medicine

## 2021-07-10 ENCOUNTER — Inpatient Hospital Stay: Payer: Self-pay

## 2021-07-10 DIAGNOSIS — R011 Cardiac murmur, unspecified: Secondary | ICD-10-CM

## 2021-07-10 DIAGNOSIS — R39198 Other difficulties with micturition: Secondary | ICD-10-CM

## 2021-07-10 LAB — ECHOCARDIOGRAM COMPLETE
AR max vel: 2.72 cm2
AV Area VTI: 2.84 cm2
AV Area mean vel: 2.7 cm2
AV Mean grad: 5 mmHg
AV Peak grad: 9 mmHg
Ao pk vel: 1.5 m/s
Area-P 1/2: 4.31 cm2
Height: 67 in
MV VTI: 2.92 cm2
S' Lateral: 2.2 cm
Weight: 2320 oz

## 2021-07-10 MED ORDER — LACTULOSE 10 GM/15ML PO SOLN
30.0000 g | Freq: Two times a day (BID) | ORAL | Status: DC
  Administered 2021-07-10 – 2021-07-11 (×2): 30 g via ORAL
  Filled 2021-07-10 (×2): qty 60

## 2021-07-10 MED ORDER — SODIUM CHLORIDE 0.9 % IV BOLUS
1000.0000 mL | Freq: Once | INTRAVENOUS | Status: AC
  Administered 2021-07-10: 1000 mL via INTRAVENOUS

## 2021-07-10 NOTE — Progress Notes (Signed)
*  PRELIMINARY RESULTS* Echocardiogram 2D Echocardiogram has been performed.  Wyatt Nelson 07/10/2021, 10:46 AM

## 2021-07-10 NOTE — Progress Notes (Signed)
Patient ID: Wyatt Nelson, male   DOB: [birth year], 25 y.o.   MRN: [id number] [facility] PROGRESS NOTE  Wyatt Nelson [id number] DOB: 11/09/1996 DOA: 07/07/2021 PCP: Pcp, No  HPI/Subjective: Patient still complaining of lot of chest pain and shortness of breath.  Pain when he moves coughs or breathes.  Admitted with pneumothorax and chest tube placed.  Having constipation and difficulty with some urination.  Objective: Vitals:   07/10/21 0300 07/10/21 0749  BP: 128/62 139/79  Pulse:  86  Resp: 16 18  Temp: 98.6 F (37 C) 98.9 F (37.2 C)  SpO2: 97% 96%    Intake/Output Summary (Last 24 hours) at 07/10/2021 1531 Last data filed at 07/10/2021 1300 Gross per 24 hour  Intake 240 ml  Output 2 ml  Net 238 ml   Filed Weights   07/07/21 0611  Weight: 65.8 kg    ROS: Review of Systems  Respiratory:  Positive for cough and shortness of breath.   Cardiovascular:  Positive for chest pain.  Gastrointestinal:  Positive for constipation. Negative for abdominal pain, nausea and vomiting.  Exam: Physical Exam HENT:     Head: Normocephalic.     Mouth/Throat:     Pharynx: No oropharyngeal exudate.  Eyes:     General: Lids are normal.     Conjunctiva/sclera: Conjunctivae normal.  Cardiovascular:     Rate and Rhythm: Normal rate and regular rhythm.     Heart sounds: S1 normal and S2 normal. Murmur heard.  Systolic murmur is present with a grade of 2/6.  Pulmonary:     Breath sounds: Examination of the left-middle field reveals decreased breath sounds. Examination of the right-lower field reveals decreased breath sounds. Examination of the left-lower field reveals decreased breath sounds. Decreased breath sounds present. No wheezing, rhonchi or rales.  Abdominal:     Palpations: Abdomen is soft.     Tenderness: There is no abdominal tenderness.  Musculoskeletal:     Right lower leg: No swelling.     Left lower leg: No swelling.  Skin:    General: Skin is warm.     Findings:  No rash.  Neurological:     Mental Status: He is alert and oriented to person, place, and time.      Scheduled Meds:  enoxaparin (LOVENOX) injection  40 mg Subcutaneous Q24H   lactulose  30 g Oral BID   polyethylene glycol  17 g Oral Daily   senna  1 tablet Oral Daily   Brief history.  25 year old smoker with no past medical history presenting with chest pain and shortness of breath found to have a large pneumothorax in the emergency room.  Chest tube placed by ER physician.  Asked to admit the patient medically by surgical team.  Surgical team following for chest tube.  Assessment/Plan:  Spontaneous pneumothorax status post chest tube placement in ER.  General surgery following.  Chest tube to waterseal today.  Repeating chest x-ray tomorrow morning and general surgery may pull the chest tube at that time. Heart murmur heard on cardiac exam.  Echocardiogram showed hyperdynamic EF.  Otherwise no valvular abnormalities.  Contrast study negative Tobacco abuse.  Patient must stop smoking.  Declined nicotine patch Leukocytosis reactive in nature. Impaired fasting glucose Constipation on senna and MiraLAX.  We will give a dose of lactulose until bowel movement. Difficulty with urinating likely secondary to pain medications and constipation. Hypokalemia replaced        Code Status:     Code Status Orders  (  From admission, onward)           Start     Ordered   07/07/21 0935  Full code  Continuous        07/07/21 0934           Code Status History     This patient has a current code status but no historical code status.      Family Communication: Family at bedside Disposition Plan: Status is: Inpatient  Dispo: The patient is from: Home              Anticipated d/c is to: Home likely after chest tube removed              Patient currently with chest tube in and cannot go home with chest tube.   Difficult to place patient.  No.  Consultants: General  surgery  Procedures: Chest tube placement  Time spent: 26 minutes  [employer]

## 2021-07-10 NOTE — Progress Notes (Signed)
[facility] SURGICAL PROGRESS NOTE (cpt [phone])  Hospital Day(s): 3.   Interval History: Patient seen and examined, no acute events or new complaints overnight. Patient appears more comfortable this morning. Still with left sided chest discomfort worse with inspiration and some SOB. He denies fever, chills, cough, nausea, emesis. Now new labs this morning. Chest tube with minimal to now drainage in last 24 hours. No air leak appreciable but again patient with very weak cough. CXR this morning appears improved.   Review of Systems:  Constitutional: denies fever, chills  HEENT: denies cough or congestion  Respiratory: + shortness of breath  Cardiovascular: + chest pain Gastrointestinal: denies abdominal pain, N/V, or diarrhea/and bowel function as per interval history Genitourinary: denies burning with urination or urinary frequency  Vital signs in last 24 hours: [min-max] current  Temp:  [98.5 F (36.9 C)-98.6 F (37 C)] 98.6 F (37 C) (08/23 0300) Pulse Rate:  [75-84] 75 (08/22 1959) Resp:  [16-22] 16 (08/23 0300) BP: (120-129)/(62-73) 128/62 (08/23 0300) SpO2:  [96 %-99 %] 97 % (08/23 0300)     Height: 5\' 7"  (170.2 cm) Weight: 65.8 kg BMI (Calculated): 22.71   Intake/Output last 2 shifts:  08/22 0701 - 08/23 0700 In: 720 [P.O.:720] Out: 2 [Chest Tube:2]   Physical Exam:  Constitutional: alert, cooperative and no distress, appears uncomfortable HENT: normocephalic without obvious abnormality  Eyes: PERRL, EOM's grossly intact and symmetric  Respiratory: short shallow breathing, no distress  Cardiovascular: regular rate and sinus rhythm  Chest: Chest tube to the left lateral chest, no appreciable air leak, site is CDI Musculoskeletal: no edema or wounds, motor and sensation grossly intact, NT    Labs:  CBC Latest Ref Rng & Units 07/08/2021 07/07/2021  WBC 4.0 - 10.5 K/uL 7.7 11.3(H)  Hemoglobin 13.0 - 17.0 g/dL 07/09/2021 25.9  Hematocrit 56.3 - 52.0 % 42.8 39.6   Platelets 150 - 400 K/uL 197 181   CMP Latest Ref Rng & Units 07/08/2021 07/07/2021  Glucose 70 - 99 mg/dL 07/09/2021) 643(P)  BUN 6 - 20 mg/dL 12 15  Creatinine 295(J - 1.24 mg/dL 8.84 1.66  Sodium 0.63 - 145 mmol/L 138 136  Potassium 3.5 - 5.1 mmol/L 3.7 3.4(L)  Chloride 98 - 111 mmol/L 104 101  CO2 22 - 32 mmol/L 28 29  Calcium 8.9 - 10.3 mg/dL 8.9 016)     Imaging studies:   CXR (07/10/2021) personally reviewed which shows improvement/resolution in left apical pneumothorax, stable chest tube position, and radiologist report reviewed below:  IMPRESSION: Tiny residual left apical pneumothorax, slightly decreased in size from the previous study. Left chest tube remains in place.   Assessment/Plan: (ICD-10's: J93.11) 25 y.o. male with persistent spontaneous left pneumothorax   - Will place chest tube to water seal today; repeat CXR in the AM. If lung remains up tomorrow morning, will plan on removal of chest tube in AM - If he recurs, may need CT Chest   - Pain control prn   - Okay to ambulate as tolerated  - Continue incentive spirometer - Further management per primary service; we will follow     - Discharge Planning: hopefully home in the morning tomorrow pending repeat CXR and chest tube removal   All of the above findings and recommendations were discussed with the patient, patient's family (wife at bedside), and the medical team, and all of patient's and family's questions were answered to their expressed satisfaction.  -- 34, PA-C  [facility] 07/10/2021, 7:34  AM [phone] M-F: 7am - 4pm

## 2021-07-10 NOTE — Progress Notes (Signed)
Checked on patient, patient not in respiratory distress, oxygen at 94% on room air, recently given pain meds, & continue to monitor.

## 2021-07-11 ENCOUNTER — Inpatient Hospital Stay: Payer: Self-pay

## 2021-07-11 LAB — CBC
HCT: 42.4 % (ref 39.0–52.0)
Hemoglobin: 14.1 g/dL (ref 13.0–17.0)
MCH: 30.3 pg (ref 26.0–34.0)
MCHC: 33.3 g/dL (ref 30.0–36.0)
MCV: 91 fL (ref 80.0–100.0)
Platelets: 191 10*3/uL (ref 150–400)
RBC: 4.66 MIL/uL (ref 4.22–5.81)
RDW: 11.9 % (ref 11.5–15.5)
WBC: 12.4 10*3/uL — ABNORMAL HIGH (ref 4.0–10.5)
nRBC: 0 % (ref 0.0–0.2)

## 2021-07-11 MED ORDER — BENZONATATE 100 MG PO CAPS
200.0000 mg | ORAL_CAPSULE | Freq: Three times a day (TID) | ORAL | Status: DC
  Administered 2021-07-11: 200 mg via ORAL
  Filled 2021-07-11: qty 2

## 2021-07-11 MED ORDER — GUAIFENESIN-DM 100-10 MG/5ML PO SYRP
5.0000 mL | ORAL_SOLUTION | ORAL | Status: DC | PRN
  Administered 2021-07-11: 5 mL via ORAL
  Filled 2021-07-11: qty 5

## 2021-07-11 MED ORDER — OXYCODONE HCL 5 MG PO TABS: 5.0000 mg | ORAL_TABLET | Freq: Four times a day (QID) | ORAL | 0 refills | Status: DC | PRN

## 2021-07-11 MED ORDER — BENZONATATE 200 MG PO CAPS: 200.0000 mg | ORAL_CAPSULE | Freq: Three times a day (TID) | ORAL | 0 refills | Status: DC

## 2021-07-11 MED ORDER — GUAIFENESIN-DM 100-10 MG/5ML PO SYRP: 5.0000 mL | ORAL_SOLUTION | ORAL | 0 refills | Status: DC | PRN

## 2021-07-11 MED ORDER — NICOTINE 21 MG/24HR TD PT24: 21.0000 mg | MEDICATED_PATCH | TRANSDERMAL | 0 refills | Status: AC

## 2021-07-11 MED ORDER — MENTHOL 3 MG MT LOZG
1.0000 | LOZENGE | OROMUCOSAL | Status: DC | PRN
  Filled 2021-07-11 (×2): qty 9

## 2021-07-11 NOTE — Progress Notes (Signed)
Mobility Specialist - Progress Note   07/11/21 1300  Mobility  Activity Ambulated in hall  Level of Assistance Independent  Assistive Device Front wheel walker  Distance Ambulated (ft) 250 ft  Mobility Ambulated independently in hallway  Mobility Response Tolerated well  Mobility performed by Mobility specialist  $Mobility charge 1 Mobility    Pre-mobility: 92 HR, 93% SpO2 Post-mobility: 113 HR, 92% SpO2   Pt sitting in bed upon arrival, spouse at bedside. Pt voiced pain in L flank 5/10 and chest pain 20/10 when coughing. Ambulated in hallway independently, pt preferred use of RW. No LOB. Denied dizziness. Upon return to room, pt ambulated without AD. Tolerated well. Pt left in bed with needs in reach.    Filiberto Pinks Mobility Specialist 07/11/21, 1:15 PM

## 2021-07-11 NOTE — Discharge Summary (Signed)
Physician Discharge Summary  Wyatt Nelson JXB:[id number] DOB: [birth year] DOA: 07/07/2021  PCP: Pcp, No  Admit date: 07/07/2021 Discharge date: 07/11/2021  Admitted From: Home Disposition: Home  Recommendations for Outpatient Follow-up:  Follow up with PCP in 1-2 weeks Follow-up with general surgery in 2 weeks  Home Health: No Equipment/Devices: None Discharge Condition: Stable CODE STATUS: Full Diet recommendation: Regular  Brief/Interim Summary:  25 y.o. male coming in with chest pain and shortness of breath.  He was awakened from sleep with chest pain radiating up into his left shoulder associated with shortness of breath.  He was found to have a left-sided pneumothorax and ER physician placed a chest tube in.  He does smoke cigarettes 1 pack/day.  COVID test negative.   Chest tube remain in place.  Followed by general surgery.  Discontinued on day of discharge.  Patient monitored for several hours after chest tube discontinuation.  Remained stable on room air.  Follow-up chest x-ray demonstrates stability of tiny apical thorax.  Given patient stability okay for discharge at this time.  Pain medication and antitussives will be prescribed.  Patient will follow up with general surgery in 2 weeks  Discharge Diagnoses:  Active Problems:   Pneumothorax   Impaired ability of von Willebrand factor (vWF) to agglutinate platelets (HCC)   Constipation   Murmur, cardiac   Difficulty urinating  Spontaneous pneumothorax History of tobacco use Chest tube placed by ER physician.  Patient remained hemodynamically stable.  General surgery consulted and follow for chest tube management.  Chest tube discontinued on day of discharge.  Follow-up with general surgery in 2 weeks.  Counseled on tobacco cessation  Discharge Instructions  Discharge Instructions     Diet - low sodium heart healthy   Complete by: As directed    Increase activity slowly   Complete by: As directed       Allergies as  of 07/11/2021   No Known Allergies      Medication List     TAKE these medications    benzonatate 200 MG capsule Commonly known as: TESSALON Take 1 capsule (200 mg total) by mouth 3 (three) times daily.   guaiFENesin-dextromethorphan 100-10 MG/5ML syrup Commonly known as: ROBITUSSIN DM Take 5 mLs by mouth every 4 (four) hours as needed for cough.   nicotine 21 mg/24hr patch Commonly known as: NICODERM CQ - dosed in mg/24 hours Place 1 patch (21 mg total) onto the skin daily.   oxyCODONE 5 MG immediate release tablet Commonly known as: Oxy IR/ROXICODONE Take 1-2 tablets (5-10 mg total) by mouth every 6 (six) hours as needed for moderate pain.        Follow-up Information     Donovan Kail, PA-C. Schedule an appointment as soon as possible for a visit in 2 week(s).   Specialty: Physician Assistant Why: Follow up for left PTX, needs CXR before appointment Contact information: [address] [place] [address] [phone]                No Known Allergies  Consultations: General surgery   Procedures/Studies: DG Chest 1 View  Result Date: 07/09/2021 CLINICAL DATA:  Pneumothorax. EXAM: CHEST  1 VIEW COMPARISON:  One-view chest x-ray 07/08/2021 FINDINGS: Left apical pneumothorax is stable. Small bore chest tube stable position. Areas of linear atelectasis in the left lung are stable. Right lung is clear. Heart size is normal. IMPRESSION: Stable left apical pneumothorax with small bore chest tube in place. Electronically Signed   By: Cristal Deer  Mattern M.D.   On: 07/09/2021 08:40   DG Chest 1 View  Result Date: 07/08/2021 CLINICAL DATA:  Pneumothorax EXAM: CHEST  1 VIEW COMPARISON:  07/07/2021 FINDINGS: Left chest catheter tip projects at the level of the posterior fourth rib. Small residual pneumothorax is unchanged. Right lung is normal IMPRESSION: Unchanged small residual left pneumothorax. Electronically Signed   By: Deatra Robinson M.D.   On:  07/08/2021 04:01   DG Chest 2 View  Result Date: 07/11/2021 CLINICAL DATA:  LEFT pneumothorax EXAM: CHEST - 2 VIEW COMPARISON:  07/10/2021 FINDINGS: LEFT thoracostomy tube unchanged. Normal heart size, mediastinal contours, and pulmonary vascularity. Increased LEFT basilar atelectasis and pleural effusion. Persistent small LEFT apex pneumothorax. RIGHT lung clear. No definite osseous abnormalities. IMPRESSION: Increased LEFT basilar pleural effusion and atelectasis. Persistent small LEFT apical pneumothorax. Electronically Signed   By: Ulyses Southward M.D.   On: 07/11/2021 08:59   DG Chest 2 View  Result Date: 07/10/2021 CLINICAL DATA:  Follow-up pneumothorax EXAM: CHEST - 2 VIEW COMPARISON:  07/09/2021 FINDINGS: Small bore left chest tube stable in positioning. Tiny residual left apical pneumothorax, slightly decreased in size from the previous study. Linear atelectasis in the left lung base. Right lung is clear. Heart size is normal. IMPRESSION: Tiny residual left apical pneumothorax, slightly decreased in size from the previous study. Left chest tube remains in place. Electronically Signed   By: Duanne Guess D.O.   On: 07/10/2021 09:39   DG Chest 2 View  Result Date: 07/07/2021 CLINICAL DATA:  Chest pain and shortness of breath. EXAM: CHEST - 2 VIEW COMPARISON:  None. FINDINGS: Midline trachea. Normal heart size and mediastinal contours. No pleural fluid. A large, approximately 50% left-sided pneumothorax identified superiorly and laterally. No mediastinal shift. Clear lungs. IMPRESSION: Large left-sided pneumothorax. Critical test results telephoned to .Dr. Elesa Massed. At the time of interpretation at 7:16 a.m.On 07/07/2021. Electronically Signed   By: Jeronimo Greaves M.D.   On: 07/07/2021 07:18   DG Chest Port 1 View  Result Date: 07/11/2021 CLINICAL DATA:  Left chest tube removal EXAM: PORTABLE CHEST 1 VIEW COMPARISON:  07/11/2021 FINDINGS: Single frontal view of the chest demonstrates interval removal  of the left chest tube. The trace left apical pneumothorax seen on the preceding exam is unchanged, volume estimated less than 5%. Persistent left basilar consolidation and small left effusion. The right chest is clear. There are no acute bony abnormalities. IMPRESSION: 1. Stable trace left apical pneumothorax after left chest tube removal. Volume estimated less than 5%. 2. Stable left basilar consolidation and small left pleural effusion. Electronically Signed   By: Sharlet Salina M.D.   On: 07/11/2021 15:51   DG Chest Port 1 View  Result Date: 07/08/2021 CLINICAL DATA:  Chest tube, pneumothorax EXAM: PORTABLE CHEST 1 VIEW COMPARISON:  07/08/2021 FINDINGS: Left small bore chest tube remains in place. Left pneumothorax slightly enlarged since prior study, now approximately 10%. Left mid lung atelectasis. Right lung clear. Heart is normal size. No acute bony abnormality. IMPRESSION: Left small bore chest tube remains in place with slight increased size of the left pneumothorax. Electronically Signed   By: Charlett Nose M.D.   On: 07/08/2021 18:21   DG Chest Portable 1 View  Result Date: 07/07/2021 CLINICAL DATA:  Left-sided pneumothorax status post chest tube placement. EXAM: PORTABLE CHEST 1 VIEW COMPARISON:  Chest radiograph performed the same day. FINDINGS: The heart size and mediastinal contours are within normal limits. There has been interval placement of a left-sided thoracostomy  tube with tip overlying the left mid lung. The left-sided pneumothorax has significantly decreased in size, now small in volume. There is mild left midlung atelectasis. The right lung is clear. There is no pleural effusion or right pneumothorax. The visualized skeletal structures are unremarkable. IMPRESSION: Interval decreased size of a left pneumothorax after chest tube placement, now small in volume. Electronically Signed   By: Romona Curls M.D.   On: 07/07/2021 09:22   ECHOCARDIOGRAM COMPLETE  Result Date: 07/10/2021     ECHOCARDIOGRAM REPORT   Patient Name:   XADRIAN CRAIGHEAD Date of Exam: 07/10/2021 Medical Rec #:  [id number]     Height:       67.0 in Accession #:    [id number]    Weight:       145.0 lb Date of Birth:  [birth year]      BSA:          1.764 m Patient Age:    25 years      BP:           139/79 mmHg Patient Gender: M             HR:           85 bpm. Exam Location:  [facility] Procedure: 2D Echo, Color Doppler, Cardiac Doppler and Saline Contrast Bubble            Study Indications:     R01.1 Murmur  History:         Patient has no prior history of Echocardiogram examinations.                  Signs/Symptoms:Chest Pain and Shortness of Breath; Risk                  Factors:Current Smoker.  Sonographer:     Humphrey Rolls Referring Phys:  [id number] Alford Highland Diagnosing Phys: Alwyn Pea MD IMPRESSIONS  1. Left ventricular ejection fraction, by estimation, is 70 to 75%. The left ventricle has hyperdynamic function. The left ventricle has no regional wall motion abnormalities. Left ventricular diastolic parameters were normal.  2. Right ventricular systolic function is normal. The right ventricular size is normal.  3. Left atrial size was mildly dilated.  4. Right atrial size was mildly dilated.  5. The mitral valve is normal in structure. Trivial mitral valve regurgitation.  6. The aortic valve is normal in structure. Aortic valve regurgitation is not visualized. Conclusion(s)/Recommendation(s): Normal biventricular function without evidence of hemodynamically significant valvular heart disease. FINDINGS  Left Ventricle: Left ventricular ejection fraction, by estimation, is 70 to 75%. The left ventricle has hyperdynamic function. The left ventricle has no regional wall motion abnormalities. The left ventricular internal cavity size was normal in size. There is borderline left ventricular hypertrophy. Left ventricular diastolic parameters were normal. Right Ventricle: The right ventricular size is normal. No increase in right  ventricular wall thickness. Right ventricular systolic function is normal. Left Atrium: Left atrial size was mildly dilated. Right Atrium: Right atrial size was mildly dilated. Pericardium: There is no evidence of pericardial effusion. Mitral Valve: The mitral valve is normal in structure. Trivial mitral valve regurgitation. MV peak gradient, 5.4 mmHg. The mean mitral valve gradient is 2.0 mmHg. Tricuspid Valve: The tricuspid valve is normal in structure. Tricuspid valve regurgitation is mild. Aortic Valve: The aortic valve is normal in structure. Aortic valve regurgitation is not visualized. Aortic valve mean gradient measures 5.0 mmHg. Aortic valve peak gradient measures 9.0 mmHg. Aortic valve area,  by VTI measures 2.84 cm. Pulmonic Valve: The pulmonic valve was normal in structure. Pulmonic valve regurgitation is not visualized. Aorta: The ascending aorta was not well visualized. IAS/Shunts: No atrial level shunt detected by color flow Doppler. Agitated saline contrast was given intravenously to evaluate for intracardiac shunting.  LEFT VENTRICLE PLAX 2D LVIDd:         4.10 cm  Diastology LVIDs:         2.20 cm  LV e' medial:    10.30 cm/s LV PW:         0.80 cm  LV E/e' medial:  8.5 LV IVS:        0.60 cm  LV e' lateral:   15.80 cm/s LVOT diam:     1.90 cm  LV E/e' lateral: 5.6 LV SV:         68 LV SV Index:   39 LVOT Area:     2.84 cm  RIGHT VENTRICLE RV Basal diam:  3.80 cm TAPSE (M-mode): 3.0 cm LEFT ATRIUM             Index       RIGHT ATRIUM          Index LA diam:        2.70 cm 1.53 cm/m  RA Area:     9.47 cm LA Vol (A2C):   24.7 ml 14.00 ml/m RA Volume:   17.80 ml 10.09 ml/m LA Vol (A4C):   27.0 ml 15.31 ml/m LA Biplane Vol: 26.2 ml 14.85 ml/m  AORTIC VALVE                    PULMONIC VALVE AV Area (Vmax):    2.72 cm     PV Vmax:       1.35 m/s AV Area (Vmean):   2.70 cm     PV Vmean:      95.900 cm/s AV Area (VTI):     2.84 cm     PV VTI:        0.248 m AV Vmax:           150.00 cm/s  PV Peak  grad:  7.3 mmHg AV Vmean:          108.000 cm/s PV Mean grad:  4.0 mmHg AV VTI:            0.241 m AV Peak Grad:      9.0 mmHg AV Mean Grad:      5.0 mmHg LVOT Vmax:         144.00 cm/s LVOT Vmean:        103.000 cm/s LVOT VTI:          0.241 m LVOT/AV VTI ratio: 1.00  AORTA Ao Root diam: 2.60 cm MITRAL VALVE MV Area (PHT): 4.31 cm    SHUNTS MV Area VTI:   2.92 cm    Systemic VTI:  0.24 m MV Peak grad:  5.4 mmHg    Systemic Diam: 1.90 cm MV Mean grad:  2.0 mmHg MV Vmax:       1.16 m/s MV Vmean:      74.6 cm/s MV Decel Time: 176 msec MV E velocity: 87.80 cm/s MV A velocity: 89.10 cm/s MV E/A ratio:  0.99 Dwayne D Callwood MD Electronically signed by Alwyn Pea MD Signature Date/Time: 07/10/2021/1:39:40 PM    Final    (Echo, Carotid, EGD, Colonoscopy, ERCP)    Subjective: Patient seen and examined on the day of discharge.  Stable no distress.  Stable for discharge home.  Discharge Exam: Vitals:   07/11/21 0449 07/11/21 0812  BP: 134/74 136/79  Pulse: 87 83  Resp: 20 18  Temp: 99.1 F (37.3 C) 99.1 F (37.3 C)  SpO2: 98% 97%   Vitals:   07/10/21 1625 07/10/21 1941 07/11/21 0449 07/11/21 0812  BP:  139/78 134/74 136/79  Pulse:  96 87 83  Resp:  Temp:  98.6 F (37 C) 99.1 F (37.3 C) 99.1 F (37.3 C)  TempSrc:  Oral Oral   SpO2: 94% 96% 98% 97%  Weight:      Height:        General: Pt is alert, awake, not in acute distress Cardiovascular: RRR, S1/S2 +, no rubs, no gallops Respiratory: CTA bilaterally, no wheezing, no rhonchi Abdominal: Soft, NT, ND, bowel sounds + Extremities: no edema, no cyanosis    The results of significant diagnostics from this hospitalization (including imaging, microbiology, ancillary and laboratory) are listed below for reference.     Microbiology: Recent Results (from the past 240 hour(s))  Resp Panel by RT-PCR (Flu A&B, Covid) Nasopharyngeal Swab     Status: None   Collection Time: 07/07/21  9:54 AM   Specimen: Nasopharyngeal  Swab; Nasopharyngeal(NP) swabs in vial transport medium  Result Value Ref Range Status   SARS Coronavirus 2 by RT PCR NEGATIVE NEGATIVE Final    Comment: (NOTE) SARS-CoV-2 target nucleic acids are NOT DETECTED.  The SARS-CoV-2 RNA is generally detectable in upper respiratory specimens during the acute phase of infection. The lowest concentration of SARS-CoV-2 viral copies this assay can detect is 138 copies/mL. A negative result does not preclude SARS-Cov-2 infection and should not be used as the sole basis for treatment or other patient management decisions. A negative result may occur with  improper specimen collection/handling, submission of specimen other than nasopharyngeal swab, presence of viral mutation(s) within the areas targeted by this assay, and inadequate number of viral copies(<138 copies/mL). A negative result must be combined with clinical observations, patient history, and epidemiological information. The expected result is Negative.  Fact Sheet for Patients:  BloggerCourse.com  Fact Sheet for Healthcare Providers:  SeriousBroker.it  This test is no t yet approved or cleared by the [place] FDA and  has been authorized for detection and/or diagnosis of SARS-CoV-2 by FDA under an Emergency Use Authorization (EUA). This EUA will remain  in effect (meaning this test can be used) for the duration of the COVID-19 declaration under Section 564(b)(1) of the Act, 21 U.S.C.section 360bbb-3(b)(1), unless the authorization is terminated  or revoked sooner.       Influenza A by PCR NEGATIVE NEGATIVE Final   Influenza B by PCR NEGATIVE NEGATIVE Final    Comment: (NOTE) The Xpert Xpress SARS-CoV-2/FLU/RSV plus assay is intended as an aid in the diagnosis of influenza from Nasopharyngeal swab specimens and should not be used as a sole basis for treatment. Nasal washings and aspirates are unacceptable for Xpert Xpress  SARS-CoV-2/FLU/RSV testing.  Fact Sheet for Patients: BloggerCourse.com  Fact Sheet for Healthcare Providers: SeriousBroker.it  This test is not yet approved or cleared by the [place] FDA and has been authorized for detection and/or diagnosis of SARS-CoV-2 by FDA under an Emergency Use Authorization (EUA). This EUA will remain in effect (meaning this test can be used) for the duration of the COVID-19 declaration under Section 564(b)(1) of the Act, 21 U.S.C. section 360bbb-3(b)(1), unless the authorization is terminated or revoked.  Performed at [facility], [address] [place], [place] [id number]      Labs: BNP (last 3 results) No results for input(s): BNP in the last 8760 hours. Basic Metabolic Panel: Recent Labs  Lab 07/07/21 0616 07/08/21 0516  NA 136 138  K 3.4* 3.7  CL 101 104  CO2 29 28  GLUCOSE 157* 110*  BUN 15 12  CREATININE 1.05 0.70  CALCIUM 8.7* 8.9   Liver Function Tests: No results for input(s): AST, ALT, ALKPHOS, BILITOT, PROT, ALBUMIN in the last 168 hours. No results for input(s): LIPASE, AMYLASE in the last 168 hours. No results for input(s): AMMONIA in the last 168 hours. CBC: Recent Labs  Lab 07/07/21 0616 07/08/21 0516 07/11/21 0605  WBC 11.3* 7.7 12.4*  HGB 13.8 14.2 14.1  HCT 39.6 42.8 42.4  MCV 92.7 92.0 91.0  PLT 181 197 191   Cardiac Enzymes: No results for input(s): CKTOTAL, CKMB, CKMBINDEX, TROPONINI in the last 168 hours. BNP: Invalid input(s): POCBNP CBG: No results for input(s): GLUCAP in the last 168 hours. D-Dimer No results for input(s): DDIMER in the last 72 hours. Hgb A1c No results for input(s): HGBA1C in the last 72 hours. Lipid Profile No results for input(s): CHOL, HDL, LDLCALC, TRIG, CHOLHDL, LDLDIRECT in the last 72 hours. Thyroid function studies No results for input(s): TSH, T4TOTAL, T3FREE, THYROIDAB in the last 72 hours.  Invalid  input(s): FREET3 Anemia work up No results for input(s): VITAMINB12, FOLATE, FERRITIN, TIBC, IRON, RETICCTPCT in the last 72 hours. Urinalysis No results found for: COLORURINE, APPEARANCEUR, LABSPEC, PHURINE, GLUCOSEU, HGBUR, BILIRUBINUR, KETONESUR, PROTEINUR, UROBILINOGEN, NITRITE, LEUKOCYTESUR Sepsis Labs Invalid input(s): PROCALCITONIN,  WBC,  LACTICIDVEN Microbiology Recent Results (from the past 240 hour(s))  Resp Panel by RT-PCR (Flu A&B, Covid) Nasopharyngeal Swab     Status: None   Collection Time: 07/07/21  9:54 AM   Specimen: Nasopharyngeal Swab; Nasopharyngeal(NP) swabs in vial transport medium  Result Value Ref Range Status   SARS Coronavirus 2 by RT PCR NEGATIVE NEGATIVE Final    Comment: (NOTE) SARS-CoV-2 target nucleic acids are NOT DETECTED.  The SARS-CoV-2 RNA is generally detectable in upper respiratory specimens during the acute phase of infection. The lowest concentration of SARS-CoV-2 viral copies this assay can detect is 138 copies/mL. A negative result does not preclude SARS-Cov-2 infection and should not be used as the sole basis for treatment or other patient management decisions. A negative result may occur with  improper specimen collection/handling, submission of specimen other than nasopharyngeal swab, presence of viral mutation(s) within the areas targeted by this assay, and inadequate number of viral copies(<138 copies/mL). A negative result must be combined with clinical observations, patient history, and epidemiological information. The expected result is Negative.  Fact Sheet for Patients:  BloggerCourse.comhttps://www.fda.gov/media/152166/download  Fact Sheet for Healthcare Providers:  SeriousBroker.ithttps://www.fda.gov/media/152162/download  This test is no t yet approved or cleared by the [place] FDA and  has been authorized for detection and/or diagnosis of SARS-CoV-2 by FDA under an Emergency Use Authorization (EUA). This EUA will remain  in effect (meaning this  test can be used) for the duration of the COVID-19 declaration under Section 564(b)(1) of the Act, 21 U.S.C.section 360bbb-3(b)(1), unless the authorization is terminated  or revoked sooner.       Influenza A by PCR NEGATIVE NEGATIVE Final   Influenza B by PCR NEGATIVE NEGATIVE Final    Comment: (NOTE) The Xpert Xpress SARS-CoV-2/FLU/RSV plus assay is intended as an aid in the diagnosis of  influenza from Nasopharyngeal swab specimens and should not be used as a sole basis for treatment. Nasal washings and aspirates are unacceptable for Xpert Xpress SARS-CoV-2/FLU/RSV testing.  Fact Sheet for Patients: BloggerCourse.com  Fact Sheet for Healthcare Providers: SeriousBroker.it  This test is not yet approved or cleared by the [place] FDA and has been authorized for detection and/or diagnosis of SARS-CoV-2 by FDA under an Emergency Use Authorization (EUA). This EUA will remain in effect (meaning this test can be used) for the duration of the COVID-19 declaration under Section 564(b)(1) of the Act, 21 U.S.C. section 360bbb-3(b)(1), unless the authorization is terminated or revoked.  Performed at [facility], [address], [place], [place] [address]      Time coordinating discharge: Over 30 minutes  SIGNED:   Tresa Moore, MD  [facility] 07/11/2021, 4:07 PM Pager   If 7PM-7AM, please contact night-coverage

## 2021-07-11 NOTE — Progress Notes (Addendum)
[facility] SURGICAL PROGRESS NOTE (cpt [phone])  Hospital Day(s): 4.   Interval History: Patient seen and examined, no acute events or new complaints overnight. Patient feeling better, still uncomfortable from chest tube. He denies fever, chills, cough, nausea, emesis. Now new labs this morning. Chest tube with minimal to now drainage in last 24 hours. No air leak appreciable but again patient with very weak cough. CXR this morning appears similar, with small left apical pneumothorax.    Review of Systems:  Constitutional: denies fever, chills  HEENT: denies cough or congestion  Respiratory: + shortness of breath  Cardiovascular: + chest pain Gastrointestinal: denies abdominal pain, N/V, or diarrhea/and bowel function as per interval history Genitourinary: denies burning with urination or urinary frequency  Vital signs in last 24 hours: [min-max] current  Temp:  [98.6 F (37 C)-99.1 F (37.3 C)] 99.1 F (37.3 C) (08/24 0449) Pulse Rate:  [86-96] 87 (08/24 0449) Resp:  [16-20] 20 (08/24 0449) BP: (126-139)/(66-79) 134/74 (08/24 0449) SpO2:  [94 %-98 %] 98 % (08/24 0449)     Height: 5\' 7"  (170.2 cm) Weight: 65.8 kg BMI (Calculated): 22.71   Intake/Output last 2 shifts:  08/23 0701 - 08/24 0700 In: 480 [P.O.:480] Out: 400 [Urine:400]   Physical Exam:  Constitutional: alert, cooperative and no distress, appears uncomfortable HENT: normocephalic without obvious abnormality  Eyes: PERRL, EOM's grossly intact and symmetric  Respiratory: short shallow breathing, no distress  Cardiovascular: regular rate and sinus rhythm  Chest: Chest tube to the left lateral chest, no appreciable air leak, site is CDI Musculoskeletal: no edema or wounds, motor and sensation grossly intact, NT    Labs:  CBC Latest Ref Rng & Units 07/11/2021 07/08/2021 07/07/2021  WBC 4.0 - 10.5 K/uL 12.4(H) 7.7 11.3(H)  Hemoglobin 13.0 - 17.0 g/dL 07/09/2021 95.2 84.1  Hematocrit 39.0 - 52.0 % 42.4 42.8 39.6   Platelets 150 - 400 K/uL 191 197 181   CMP Latest Ref Rng & Units 07/08/2021 07/07/2021  Glucose 70 - 99 mg/dL 07/09/2021) 401(U)  BUN 6 - 20 mg/dL 12 15  Creatinine 272(Z - 1.24 mg/dL 3.66 4.40  Sodium 3.47 - 145 mmol/L 138 136  Potassium 3.5 - 5.1 mmol/L 3.7 3.4(L)  Chloride 98 - 111 mmol/L 104 101  CO2 22 - 32 mmol/L 28 29  Calcium 8.9 - 10.3 mg/dL 8.9 425)     Imaging studies:   CXR (07/11/2021) personally reviewed which shows stable small left apical pneumothorax, stable chest tube position, and radiologist report reviewed below:  IMPRESSION: Increased LEFT basilar pleural effusion and atelectasis.   Persistent small LEFT apical pneumothorax.   Assessment/Plan: (ICD-10's: J93.11) 25 y.o. male with persistent spontaneous left pneumothorax   - Chest tube removed at bedside; dressing placed  - Will repeat CXR at 1400  - Pain control prn   - Okay to ambulate as tolerated  - Continue incentive spirometer; continue to use at home - Further management per primary service; we will follow     - Discharge Planning: If repeat CXR appears stable, he can be discharged this afternoon. He can follow up with me in 2 weeks with repeat CXR at that time  All of the above findings and recommendations were discussed with the patient, patient's family (wife at bedside), and the medical team, and all of patient's and family's questions were answered to their expressed satisfaction.  -- 32, PA-C [facility] 07/11/2021, 7:31 AM [phone] M-F: 7am - 4pm   I agree with  the above documentation, exam, and plan, which I have edited where appropriate. Duanne Guess  12:17 PM

## 2021-07-11 NOTE — Progress Notes (Signed)
Patient and wife given instructions to keep follow up appointment, when to return for worsening symptoms, IV taken out, Medication education given, & pending patient transport.

## 2021-07-16 LAB — LYME DISEASE, WESTERN BLOT
IgG P18 Ab.: ABSENT
IgG P23 Ab.: ABSENT
IgG P28 Ab.: ABSENT
IgG P30 Ab.: ABSENT
IgG P39 Ab.: ABSENT
IgG P41 Ab.: ABSENT
IgG P45 Ab.: ABSENT
IgG P58 Ab.: ABSENT
IgG P66 Ab.: ABSENT
IgG P93 Ab.: ABSENT
IgM P23 Ab.: ABSENT
IgM P39 Ab.: ABSENT
IgM P41 Ab.: ABSENT
Lyme IgG Wb: NEGATIVE
Lyme IgM Wb: NEGATIVE

## 2021-07-23 ENCOUNTER — Emergency Department: Payer: Medicaid Other

## 2021-07-23 ENCOUNTER — Encounter: Payer: Self-pay | Admitting: Radiology

## 2021-07-23 ENCOUNTER — Other Ambulatory Visit: Payer: Self-pay

## 2021-07-23 ENCOUNTER — Observation Stay
Admission: EM | Admit: 2021-07-23 | Discharge: 2021-07-24 | Disposition: A | Discharge status: Home / Self Care | Payer: Medicaid Other | Attending: Hospitalist | Admitting: Hospitalist

## 2021-07-23 DIAGNOSIS — J9383 Other pneumothorax: Secondary | ICD-10-CM

## 2021-07-23 DIAGNOSIS — F17210 Nicotine dependence, cigarettes, uncomplicated: Secondary | ICD-10-CM | POA: Insufficient documentation

## 2021-07-23 DIAGNOSIS — R0602 Shortness of breath: Secondary | ICD-10-CM

## 2021-07-23 DIAGNOSIS — J939 Pneumothorax, unspecified: Secondary | ICD-10-CM | POA: Diagnosis present

## 2021-07-23 DIAGNOSIS — Z720 Tobacco use: Secondary | ICD-10-CM

## 2021-07-23 DIAGNOSIS — I319 Disease of pericardium, unspecified: Secondary | ICD-10-CM

## 2021-07-23 DIAGNOSIS — J9311 Primary spontaneous pneumothorax: Principal | ICD-10-CM | POA: Insufficient documentation

## 2021-07-23 DIAGNOSIS — I309 Acute pericarditis, unspecified: Secondary | ICD-10-CM

## 2021-07-23 LAB — CBC
HCT: 44.2 % (ref 39.0–52.0)
Hemoglobin: 15.4 g/dL (ref 13.0–17.0)
MCH: 31.8 pg (ref 26.0–34.0)
MCHC: 34.8 g/dL (ref 30.0–36.0)
MCV: 91.1 fL (ref 80.0–100.0)
Platelets: 263 10*3/uL (ref 150–400)
RBC: 4.85 MIL/uL (ref 4.22–5.81)
RDW: 12 % (ref 11.5–15.5)
WBC: 8 10*3/uL (ref 4.0–10.5)
nRBC: 0 % (ref 0.0–0.2)

## 2021-07-23 LAB — COMPREHENSIVE METABOLIC PANEL
ALT: 19 U/L (ref 0–44)
AST: 21 U/L (ref 15–41)
Albumin: 4.3 g/dL (ref 3.5–5.0)
Alkaline Phosphatase: 71 U/L (ref 38–126)
Anion gap: 4 — ABNORMAL LOW (ref 5–15)
BUN: 17 mg/dL (ref 6–20)
CO2: 31 mmol/L (ref 22–32)
Calcium: 9.1 mg/dL (ref 8.9–10.3)
Chloride: 101 mmol/L (ref 98–111)
Creatinine, Ser: 0.79 mg/dL (ref 0.61–1.24)
GFR, Estimated: >60 mL/min (ref >60)
Glucose, Bld: 96 mg/dL (ref 70–99)
Potassium: 3.9 mmol/L (ref 3.5–5.1)
Sodium: 136 mmol/L (ref 135–145)
Total Bilirubin: 0.8 mg/dL (ref 0.3–1.2)
Total Protein: 8.3 g/dL — ABNORMAL HIGH (ref 6.5–8.1)

## 2021-07-23 LAB — TROPONIN I (HIGH SENSITIVITY)
Troponin I (High Sensitivity): <2 ng/L (ref <18)
Troponin I (High Sensitivity): 2 ng/L (ref <18)

## 2021-07-23 LAB — SEDIMENTATION RATE: Sed Rate: 6 mm/hr (ref 0–15)

## 2021-07-23 LAB — LIPASE, BLOOD: Lipase: 23 U/L (ref 11–51)

## 2021-07-23 MED ORDER — MORPHINE SULFATE (PF) 4 MG/ML IV SOLN
4.0000 mg | Freq: Once | INTRAVENOUS | Status: AC
  Administered 2021-07-23: 4 mg via INTRAVENOUS
  Filled 2021-07-23: qty 1

## 2021-07-23 MED ORDER — LACTATED RINGERS IV BOLUS
1000.0000 mL | Freq: Once | INTRAVENOUS | Status: AC
  Administered 2021-07-23: 1000 mL via INTRAVENOUS

## 2021-07-23 MED ORDER — COLCHICINE 0.6 MG PO TABS: 0.6000 mg | ORAL_TABLET | Freq: Every day | ORAL | Status: DC

## 2021-07-23 MED ORDER — NICOTINE 21 MG/24HR TD PT24
21.0000 mg | MEDICATED_PATCH | TRANSDERMAL | Status: DC
  Administered 2021-07-23: 21 mg via TRANSDERMAL
  Filled 2021-07-23: qty 1

## 2021-07-23 MED ORDER — COLCHICINE 0.6 MG PO TABS
0.6000 mg | ORAL_TABLET | Freq: Two times a day (BID) | ORAL | Status: AC
  Administered 2021-07-23 – 2021-07-24 (×2): 0.6 mg via ORAL
  Filled 2021-07-23 (×2): qty 1

## 2021-07-23 MED ORDER — ENOXAPARIN SODIUM 40 MG/0.4ML IJ SOSY
40.0000 mg | PREFILLED_SYRINGE | INTRAMUSCULAR | Status: DC
  Administered 2021-07-23: 40 mg via SUBCUTANEOUS
  Filled 2021-07-23: qty 0.4

## 2021-07-23 MED ORDER — NICOTINE 21 MG/24HR TD PT24: 21.0000 mg | MEDICATED_PATCH | Freq: Every day | TRANSDERMAL | Status: DC

## 2021-07-23 MED ORDER — IOHEXOL 350 MG/ML SOLN
75.0000 mL | Freq: Once | INTRAVENOUS | Status: AC | PRN
  Administered 2021-07-23: 75 mL via INTRAVENOUS

## 2021-07-23 MED ORDER — IBUPROFEN 400 MG PO TABS
600.0000 mg | ORAL_TABLET | Freq: Three times a day (TID) | ORAL | Status: DC
  Administered 2021-07-23: 600 mg via ORAL
  Administered 2021-07-24: 400 mg via ORAL
  Filled 2021-07-23 (×2): qty 2

## 2021-07-23 NOTE — ED Notes (Signed)
Per MD place pt on NRB at 15 lpm.

## 2021-07-23 NOTE — Congregational Nurse Program (Addendum)
Patient arrived from ED via stretcher with NT and wife. Patient ambulated to bed in room independently. Patient in NAD at time of arrival. Secondary skin check done with Lindsay,RN. Telemetry applied and 2nd verification done with Shawn,NT. Patient oriented to room and surroundings. Bed placed in lowest,locked position. Call bell placed in reach, patient encouraged to call out with any needs.

## 2021-07-23 NOTE — ED Triage Notes (Signed)
Pt to ED for sudden shob and pain to chest. Pt pale in color, having difficulty speaking.  Taking shallow breaths Skin color pale  Reports recent collapsed lung

## 2021-07-23 NOTE — H&P (Signed)
History and Physical    Meridee Score EHM:[id number] DOB: 01/23/1996 DOA: 07/23/2021  PCP: Pcp, No  Patient coming from: Home  I have personally briefly reviewed patient's old medical records in [place]  Chief Complaint: chest pain  HPI: Wyatt Nelson is a 25 y.o. male with medical history significant for recent spontaneous pneumothorax, tobacco use who presents with concerns of chest pain.  Patient recently hospitalized from 8/22-8/24 for spontaneous left pneumothorax requiring chest tube which was removed at discharge. States since leaving the hospital he has noticed new worsening anterior left sided chest pain at the costal margin and left back pain.  Site of pain is different from his previous chest tube placement.  Pain worse with movement. He denies any increasing shortness of breath.  States he continues to have a chronic cough from his last admission to now.  Denies any runny nose.  No fever.  He previously smoked but states he has not switched to chewing tobacco.  Denies any alcohol or illicit drug use.  ED Course: He initially was hypotensive down to systolic of 79 which resolved on its own without any intervention.  CT of the chest shows residual apical left-sided small/recurrent pneumothorax.  EKG with diffuse ST elevation.  Troponin is negative.  ED physician Dr. Charna Archer discussed with general surgery who recommend monitoring overnight without any surgical intervention.  Hospitalist then called for admission.  Review of Systems: Constitutional: No Weight Change, No Fever ENT/Mouth: No sore throat, No Rhinorrhea Eyes: No Eye Pain, No Vision Changes Cardiovascular: + Chest Pain, no SOB, No PND, No Dyspnea on Exertion, No Orthopnea, No Claudication, No Edema,  Respiratory: + Cough, No Sputum, No Wheezing, no Dyspnea  Gastrointestinal: No Nausea, No Vomiting, No DiarrheaNo Pain Genitourinary: no Urinary Incontinence Musculoskeletal: No Arthralgias, No Myalgias Skin: No  Skin Lesions, No Pruritus, Neuro: no Weakness, No Numbness Psych: No Anxiety/Panic, No Depression, no decrease appetite Heme/Lymph: No Bruising, No Bleeding  Past Medical History:  Diagnosis Date   Medical history non-contributory     Past Surgical History:  Procedure Laterality Date   NO PAST SURGERIES       reports that he has been smoking cigarettes. He has been smoking an average of 1 pack per day. He has never used smokeless tobacco. He reports that he does not currently use alcohol. He reports that he does not use drugs. Social History  No Known Allergies  Family History  Problem Relation Age of Onset   Healthy Mother    Healthy Father      Prior to Admission medications   Medication Sig Start Date End Date Taking? Authorizing Provider  benzonatate (TESSALON) 200 MG capsule Take 1 capsule (200 mg total) by mouth 3 (three) times daily. 07/11/21   Sreenath, Trula Slade, MD  guaiFENesin-dextromethorphan (ROBITUSSIN DM) 100-10 MG/5ML syrup Take 5 mLs by mouth every 4 (four) hours as needed for cough. 07/11/21   Sreenath, Sudheer B, MD  nicotine (NICODERM CQ - DOSED IN MG/24 HOURS) 21 mg/24hr patch Place 1 patch (21 mg total) onto the skin daily. 07/11/21 08/10/21  Sidney Ace, MD  oxyCODONE (OXY IR/ROXICODONE) 5 MG immediate release tablet Take 1-2 tablets (5-10 mg total) by mouth every 6 (six) hours as needed for moderate pain. 07/11/21   Sidney Ace, MD    Physical Exam: Vitals:   07/23/21 1704 07/23/21 1730 07/23/21 1830 07/23/21 1911  BP: 129/88 114/75 113/68   Pulse: 90 66 69   Resp:  19 19  SpO2: 98% 99% 100% 100%  Weight:      Height:        Constitutional: NAD, calm, anxious appearing young male pacing around room Vitals:   07/23/21 1704 07/23/21 1730 07/23/21 1830 07/23/21 1911  BP: 129/88 114/75 113/68   Pulse: 90 66 69   Resp:  19 19   SpO2: 98% 99% 100% 100%  Weight:      Height:       Eyes: PERRL, lids and conjunctivae normal ENMT:  Mucous membranes are moist.  Neck: normal, supple Respiratory: clear to auscultation bilaterally, no wheezing, no crackles. Normal respiratory effort on room air. No accessory muscle use.  Cardiovascular: Regular rate and rhythm, no murmurs / rubs / gallops. No extremity edema.  Abdomen: no tenderness, no masses palpated.  Bowel sounds positive.  Musculoskeletal: no clubbing / cyanosis. No joint deformity upper and lower extremities. Good ROM, no contractures. Normal muscle tone.  Skin: no rashes, lesions, ulcers. No induration Neurologic: CN 2-12 grossly intact. Sensation intact, Strength 5/5 in all 4.  Psychiatric: Normal judgment and insight. Alert and oriented x 3. Normal mood.    Labs on Admission: I have personally reviewed following labs and imaging studies  CBC: Recent Labs  Lab 07/23/21 1711  WBC 8.0  HGB 15.4  HCT 44.2  MCV 91.1  PLT 102   Basic Metabolic Panel: Recent Labs  Lab 07/23/21 1711  NA 136  K 3.9  CL 101  CO2 31  GLUCOSE 96  BUN 17  CREATININE 0.79  CALCIUM 9.1   GFR: Estimated Creatinine Clearance: 129.5 mL/min (by C-G formula based on SCr of 0.79 mg/dL). Liver Function Tests: Recent Labs  Lab 07/23/21 1711  AST 21  ALT 19  ALKPHOS 71  BILITOT 0.8  PROT 8.3*  ALBUMIN 4.3   Recent Labs  Lab 07/23/21 1711  LIPASE 23   No results for input(s): AMMONIA in the last 168 hours. Coagulation Profile: No results for input(s): INR, PROTIME in the last 168 hours. Cardiac Enzymes: No results for input(s): CKTOTAL, CKMB, CKMBINDEX, TROPONINI in the last 168 hours. BNP (last 3 results) No results for input(s): PROBNP in the last 8760 hours. HbA1C: No results for input(s): HGBA1C in the last 72 hours. CBG: No results for input(s): GLUCAP in the last 168 hours. Lipid Profile: No results for input(s): CHOL, HDL, LDLCALC, TRIG, CHOLHDL, LDLDIRECT in the last 72 hours. Thyroid Function Tests: No results for input(s): TSH, T4TOTAL, FREET4, T3FREE,  THYROIDAB in the last 72 hours. Anemia Panel: No results for input(s): VITAMINB12, FOLATE, FERRITIN, TIBC, IRON, RETICCTPCT in the last 72 hours. Urine analysis: No results found for: COLORURINE, APPEARANCEUR, LABSPEC, [place], GLUCOSEU, HGBUR, BILIRUBINUR, KETONESUR, PROTEINUR, UROBILINOGEN, NITRITE, LEUKOCYTESUR  Radiological Exams on Admission: CT Angio Chest PE W/Cm &/Or Wo Cm  Result Date: 07/23/2021 CLINICAL DATA:  PE suspected, high probability. Additional provided: Shortness of breath and pain to chest, pale in color, difficulty speaking. Taking shallow breaths. Skin color pale. Recent collapse lung. EXAM: CT ANGIOGRAPHY CHEST WITH CONTRAST TECHNIQUE: Multidetector CT imaging of the chest was performed using the standard protocol during bolus administration of intravenous contrast. Multiplanar CT image reconstructions and MIPs were obtained to evaluate the vascular anatomy. CONTRAST:  18mL OMNIPAQUE IOHEXOL 350 MG/ML SOLN COMPARISON:  Prior chest radiographs 07/23/2021 and earlier. FINDINGS: CARDIOVASCULAR: Heart size within normal limits.No pericardial effusion. No significant vascular finding. Satisfactory opacification of the pulmonary arteries to the segmental level.No evidence of pulmonary embolus. MEDIASTINUM/NODES: No mediastinal, hilar or  axillary lymphadenopathy. Visualized thyroid gland unremarkable. LUNGS/PLEURA: Small residual/recurrent left pneumothorax at the apex, and with an additional component more inferiorly. No airspace consolidation. Linear opacity within the inferior left upper lobe laterally, compatible with atelectasis or scarring. Central airways grossly patent. UPPER ABDOMEN: No acute or significant finding. MUSCULOSKELETAL: No acute fracture or aggressive osseous lesion. The chest wall is unremarkable. Review of the MIP images confirms the above findings. These results were called by telephone at the time of interpretation on 07/23/2021 at 6:23 pm to provider [facility] ,  who verbally acknowledged these results. IMPRESSION: Small residual/recurrent left pneumothorax at the apex, and with an additional component more inferiorly. No evidence of pulmonary embolism. Linear opacity within the inferior left upper lobe, compatible with atelectasis or scarring. Electronically Signed   By: Kellie Simmering D.O.   On: 07/23/2021 18:31   DG Chest Port 1 View  Result Date: 07/23/2021 CLINICAL DATA:  Shortness of breath and chest pain. EXAM: PORTABLE CHEST 1 VIEW COMPARISON:  Radiographs 07/11/2021 FINDINGS: Previous left apical pneumothorax is not definitively seen on the current exam. Improvement in the left basilar opacity with minimal subsegmental atelectasis or scar in the left mid lung. Improved left lung volume loss. No significant pleural effusion. The heart is normal in size with normal mediastinal contours. No acute osseous abnormalities are seen. IMPRESSION: 1. Previous left apical pneumothorax is not definitively seen on the current exam. 2. Improvement in left basilar opacity with minimal subsegmental atelectasis or scar in the left mid lung. Electronically Signed   By: Keith Rake M.D.   On: 07/23/2021 17:39      Assessment/Plan  Residual/recurrent left apical pneumothorax -Patient recently discharged for left-sided spontaneous pneumothorax requiring chest tube placement.   -General surgery consulted by ED physician and recommends observation without any surgical intervention  -stable on room air at this time  Suspected pericarditis  -no pericardial effusion seen on CTA chest  -however EKG with diffuse ST elevation and he notes worsening chest pain since last hospitalization  -start iburprofen and colchicine and observe symptoms clinically  -obtain ESR and CRP  Tobacco use -pt has switched from smoking to chewing tobacco. Cessation was advised.   Questionable hypotension -Initially presented with systolic of 45G in triage with blood pressure once roomed in  the ED.  Suspect possibly erroneous read  DVT prophylaxis:.Lovenox Code Status: Full Family Communication: Plan discussed with patient at bedside  disposition Plan: Home with observation Consults called:  Admission status: Observation  Level of care: Progressive Cardiac  Status is: Observation  The patient remains OBS appropriate and will d/c before 2 midnights.  Dispo: The patient is from: Home              Anticipated d/c is to: Home              Patient currently is not medically stable to d/c.   Difficult to place patient No         Orene Desanctis DO [facility]   If 7PM-7AM, please contact night-coverage [email or web address]   07/23/2021, 7:33 PM

## 2021-07-23 NOTE — ED Provider Notes (Signed)
Labs  [facility] Emergency Department Provider Note   ____________________________________________   Event Date/Time   First MD Initiated Contact with Patient 07/23/21 1701     (approximate)  I have reviewed the triage vital signs and the nursing notes.   HISTORY  Chief Complaint Shortness of Breath    HPI Wyatt Nelson is a 25 y.o. male with past medical history of pneumothorax who presents to the ED complaining of chest pain.  Patient reports that he has been dealing with intermittent pain in the left side of his chest ever since he required chest tube placement for spontaneous pneumothorax about 2 weeks ago.  Pain seemed to become acutely more severe today and he describes it as sharp, exacerbated when he goes to take a deep breath.  It extends under his left costal margin but he denies any associated abdominal pain, nausea, or vomiting.  He has not had any recent fevers or cough.  He has quit smoking since prior pneumothorax, denies any drug use.  He describes current symptoms today as when he developed pneumothorax in the past.        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 Authorizing Provider  benzonatate (TESSALON) 200 MG capsule Take 1 capsule (200 mg total) by mouth 3 (three) times daily. 07/11/21   Sreenath, Jonelle Sports, MD  guaiFENesin-dextromethorphan (ROBITUSSIN DM) 100-10 MG/5ML syrup Take 5 mLs by mouth every 4 (four) hours as  needed for cough. 07/11/21   Sreenath, Sudheer B, MD  nicotine (NICODERM CQ - DOSED IN MG/24 HOURS) 21 mg/24hr patch Place 1 patch (21 mg total) onto the skin daily. 07/11/21 08/10/21  Tresa Moore, MD  oxyCODONE (OXY IR/ROXICODONE) 5 MG immediate release tablet Take 1-2 tablets (5-10 mg total) by mouth every 6 (six) hours as needed for moderate pain. 07/11/21   Tresa Moore, MD    Allergies Patient has no known allergies.  Family History  Problem Relation Age of Onset   Healthy Mother    Healthy Father     Social History Social History   Tobacco Use   Smoking status: Every Day    Packs/day: 1.00    Types: Cigarettes   Smokeless tobacco: Never  Substance Use Topics   Alcohol use: Not Currently   Drug use: Never    Review of Systems  Constitutional: No fever/chills Eyes: No visual changes. ENT: No sore throat. Cardiovascular: Positive for chest pain. Respiratory: Positive for shortness of breath. Gastrointestinal: No abdominal pain.  No nausea, no vomiting.  No diarrhea.  No constipation. Genitourinary: Negative for dysuria. Musculoskeletal: Negative for back pain. Skin: Negative for rash. Neurological: Negative for headaches, focal weakness or numbness.  ____________________________________________   PHYSICAL EXAM:  VITAL SIGNS: ED Triage Vitals  Enc Vitals Group     BP 07/23/21 1700 (!) 79/53     Pulse Rate 07/23/21 1700 83  Resp 07/23/21 1700 (!) 24     Temp --      Temp src --      SpO2 07/23/21 1700 100 %     Weight 07/23/21 1658 145 lb 8.1 oz (66 kg)     Height 07/23/21 1658 5\' 7"  (1.702 m)     Head Circumference --      Peak Flow --      Pain Score 07/23/21 1657 10     Pain Loc --      Pain Edu? --      Excl. in GC? --     Constitutional: Alert and oriented. Eyes: Conjunctivae are normal. Head: Atraumatic. Nose: No congestion/rhinnorhea. Mouth/Throat: Mucous membranes are moist. Neck: Normal ROM Cardiovascular: Normal rate,  regular rhythm. Grossly normal heart sounds.  2+ radial pulses bilaterally. Respiratory: Normal respiratory effort.  No retractions. Lungs CTAB.  Left chest wall tenderness to palpation. Gastrointestinal: Soft and nontender. No distention. Genitourinary: deferred Musculoskeletal: No lower extremity tenderness nor edema. Neurologic:  Normal speech and language. No gross focal neurologic deficits are appreciated. Skin:  Skin is warm, dry and intact. No rash noted. Psychiatric: Mood and affect are normal. Speech and behavior are normal.  ____________________________________________   LABS (all labs ordered are listed, but only abnormal results are displayed)  Labs Reviewed  COMPREHENSIVE METABOLIC PANEL - Abnormal; Notable for the following components:      Result Value   Total Protein 8.3 (*)    Anion gap 4 (*)    All other components within normal limits  CBC  LIPASE, BLOOD  SEDIMENTATION RATE  BASIC METABOLIC PANEL  C-REACTIVE PROTEIN  TROPONIN I (HIGH SENSITIVITY)  TROPONIN I (HIGH SENSITIVITY)   ____________________________________________  EKG  ED ECG REPORT I, 09/22/21, the attending physician, personally viewed and interpreted this ECG.   Date: 07/23/2021  EKG Time: 17:11  Rate: 72  Rhythm: normal sinus rhythm  Axis: Normal  Intervals:none  ST&T Change: Diffuse ST elevation, possible pericarditis   PROCEDURES  Procedure(s) performed (including Critical Care):  Procedures   ____________________________________________   INITIAL IMPRESSION / ASSESSMENT AND PLAN / ED COURSE      25 year old male with past medical history of spontaneous pneumothorax who presents to the ED complaining of acute onset pleuritic chest pain about 1 to 2 hours prior to arrival.  Patient initially noted to be hypertensive in triage, immediately brought back to her room however blood pressure at that time was improved without intervention.  Chest x-ray performed and reviewed  by me, shows no large pneumothorax, infiltrate, or edema.  EKG does show mild diffuse ST elevations consistent with pericarditis which could explain patient's symptoms, low suspicion for ACS.  We will treat symptomatically with IV morphine, hydrate with IV fluids, and reassess.  Labs are unremarkable, troponin within normal limits.  CTA chest was performed and negative for PE but does show very small residual versus recurrent pneumothorax on the left.  This is too small to necessitate chest tube placement, findings reviewed with general surgery, who agrees with plan for supplemental oxygen and admission to hospitalist service.  Patient was placed on a nonrebreather and case discussed with hospitalist for admission.  He remained stable, pain improved following dose of IV morphine.      ____________________________________________   FINAL CLINICAL IMPRESSION(S) / ED DIAGNOSES  Final diagnoses:  Shortness of breath  Spontaneous pneumothorax     ED Discharge Orders     None  Note:  This document was prepared using Dragon voice recognition software and may include unintentional dictation errors.    Chesley Noon, MD 07/23/21 [phone]

## 2021-07-24 LAB — BASIC METABOLIC PANEL
Anion gap: 6 (ref 5–15)
BUN: 18 mg/dL (ref 6–20)
CO2: 26 mmol/L (ref 22–32)
Calcium: 8.7 mg/dL — ABNORMAL LOW (ref 8.9–10.3)
Chloride: 103 mmol/L (ref 98–111)
Creatinine, Ser: 0.82 mg/dL (ref 0.61–1.24)
GFR, Estimated: >60 mL/min (ref >60)
Glucose, Bld: 95 mg/dL (ref 70–99)
Potassium: 3.8 mmol/L (ref 3.5–5.1)
Sodium: 135 mmol/L (ref 135–145)

## 2021-07-24 LAB — C-REACTIVE PROTEIN: CRP: 0.6 mg/dL (ref <1.0)

## 2021-07-24 MED ORDER — MORPHINE SULFATE (PF) 2 MG/ML IV SOLN: 2.0000 mg | INTRAVENOUS | Status: DC | PRN

## 2021-07-24 MED ORDER — IBUPROFEN 600 MG PO TABS: 600.0000 mg | ORAL_TABLET | Freq: Three times a day (TID) | ORAL | 0 refills | Status: AC

## 2021-07-24 MED ORDER — CYCLOBENZAPRINE HCL 10 MG PO TABS: 10.0000 mg | ORAL_TABLET | Freq: Three times a day (TID) | ORAL | 0 refills | Status: AC | PRN

## 2021-07-24 MED ORDER — KETOROLAC TROMETHAMINE 15 MG/ML IJ SOLN
15.0000 mg | Freq: Four times a day (QID) | INTRAMUSCULAR | Status: DC | PRN
  Administered 2021-07-24: 15 mg via INTRAVENOUS
  Filled 2021-07-24: qty 1

## 2021-07-27 ENCOUNTER — Encounter: Payer: Self-pay | Admitting: Physician Assistant

## 2021-07-27 ENCOUNTER — Other Ambulatory Visit: Payer: Self-pay

## 2021-07-27 ENCOUNTER — Ambulatory Visit (INDEPENDENT_AMBULATORY_CARE_PROVIDER_SITE_OTHER): Payer: Self-pay | Admitting: Physician Assistant

## 2021-07-27 VITALS — BP 118/70 | HR 80 | Temp 98.0°F | Ht 69.0 in | Wt 120.0 lb

## 2021-07-27 DIAGNOSIS — Z09 Encounter for follow-up examination after completed treatment for conditions other than malignant neoplasm: Secondary | ICD-10-CM

## 2021-07-27 DIAGNOSIS — J9311 Primary spontaneous pneumothorax: Secondary | ICD-10-CM

## 2021-07-27 NOTE — Discharge Summary (Signed)
Physician Discharge Summary   Wyatt Nelson  male DOB: [birth year]  FBX:[id number]  PCP: Pcp, No  Admit date: 07/23/2021 Discharge date: 07/24/2021  Admitted From: home Disposition:  home CODE STATUS: Full code   Hospital Course:  For full details, please see H&P, progress notes, consult notes and ancillary notes.  Briefly,  Wyatt Nelson is a 25 y.o. male with medical history significant for recent spontaneous pneumothorax, tobacco use who presents with concerns of chest pain.   Patient recently hospitalized from 8/22-8/24 for spontaneous left pneumothorax requiring chest tube which was removed at discharge. States since leaving the hospital he has noticed new worsening anterior left sided chest pain at the costal margin and left back pain.  Site of pain is different from his previous chest tube placement.  Pain worse with movement. He denies any increasing shortness of breath.  Chest pain 2/2 Costochondritis  Pericarditis ruled out -no pericardial effusion seen on CTA chest.  Pain was directly palpable below the anterior rib cage.  ESR and CRP both wnl.   --Pt was discharged on Advil for anti-inflammatory and flexeril.  Residual left apical pneumothorax -Patient recently discharged for left-sided spontaneous pneumothorax requiring chest tube placement.   --CTA chest showed small residual left pneumothorax at the apex. -General surgery consulted by ED physician and recommends observation without any surgical intervention  -stable on room air at this time --Per GenSurg, pt should not lift >15 lbs for 2 weeks, and will see pt at followup as outpatient.   Tobacco use -pt has switched from smoking to chewing tobacco. Cessation was advised.     Discharge Diagnoses:  Principal Problem:   Pneumothorax Active Problems:   Tobacco abuse   Pericarditis   30 Day Unplanned Readmission Risk Score    Flowsheet Row ED to Hosp-Admission (Discharged) from 07/07/2021 in [place]  30 Day Unplanned Readmission Risk Score (%) 8.79 Filed at 07/11/2021 1600       This score is the patient's risk of an unplanned readmission within 30 days of being discharged (0 -100%). The score is based on dignosis, age, lab data, medications, orders, and past utilization.   Low:  0-14.9   Medium: 15-21.9   High: 22-29.9   Extreme: 30 and above         Discharge Instructions:  Allergies as of 07/24/2021   No Known Allergies      Medication List     STOP taking these medications    benzonatate 200 MG capsule Commonly known as: TESSALON   oxyCODONE 5 MG immediate release tablet Commonly known as: Oxy IR/ROXICODONE       TAKE these medications    cyclobenzaprine 10 MG tablet Commonly known as: FLEXERIL Take 1 tablet (10 mg total) by mouth 3 (three) times daily as needed for up to 7 days for muscle spasms.   guaiFENesin-dextromethorphan 100-10 MG/5ML syrup Commonly known as: ROBITUSSIN DM Take 5 mLs by mouth every 4 (four) hours as needed for cough.   ibuprofen 600 MG tablet Commonly known as: ADVIL Take 1 tablet (600 mg total) by mouth 3 (three) times daily for 7 days.   nicotine 21 mg/24hr patch Commonly known as: NICODERM CQ - dosed in mg/24 hours Place 1 patch (21 mg total) onto the skin daily.          No Known Allergies   The results of significant diagnostics from this hospitalization (including imaging, microbiology, ancillary and laboratory) are listed below for reference.  Consultations:   Procedures/Studies: DG Chest 1 View  Result Date: 07/09/2021 CLINICAL DATA:  Pneumothorax. EXAM: CHEST  1 VIEW COMPARISON:  One-view chest x-ray 07/08/2021 FINDINGS: Left apical pneumothorax is stable. Small bore chest tube stable position. Areas of linear atelectasis in the left lung are stable. Right lung is clear. Heart size is normal. IMPRESSION: Stable left apical pneumothorax with small bore chest tube in place.  Electronically Signed   By: San Morelle M.D.   On: 07/09/2021 08:40   DG Chest 1 View  Result Date: 07/08/2021 CLINICAL DATA:  Pneumothorax EXAM: CHEST  1 VIEW COMPARISON:  07/07/2021 FINDINGS: Left chest catheter tip projects at the level of the posterior fourth rib. Small residual pneumothorax is unchanged. Right lung is normal IMPRESSION: Unchanged small residual left pneumothorax. Electronically Signed   By: Ulyses Jarred M.D.   On: 07/08/2021 04:01   DG Chest 2 View  Result Date: 07/11/2021 CLINICAL DATA:  LEFT pneumothorax EXAM: CHEST - 2 VIEW COMPARISON:  07/10/2021 FINDINGS: LEFT thoracostomy tube unchanged. Normal heart size, mediastinal contours, and pulmonary vascularity. Increased LEFT basilar atelectasis and pleural effusion. Persistent small LEFT apex pneumothorax. RIGHT lung clear. No definite osseous abnormalities. IMPRESSION: Increased LEFT basilar pleural effusion and atelectasis. Persistent small LEFT apical pneumothorax. Electronically Signed   By: Lavonia Dana M.D.   On: 07/11/2021 08:59   DG Chest 2 View  Result Date: 07/10/2021 CLINICAL DATA:  Follow-up pneumothorax EXAM: CHEST - 2 VIEW COMPARISON:  07/09/2021 FINDINGS: Small bore left chest tube stable in positioning. Tiny residual left apical pneumothorax, slightly decreased in size from the previous study. Linear atelectasis in the left lung base. Right lung is clear. Heart size is normal. IMPRESSION: Tiny residual left apical pneumothorax, slightly decreased in size from the previous study. Left chest tube remains in place. Electronically Signed   By: Davina Poke D.O.   On: 07/10/2021 09:39   DG Chest 2 View  Result Date: 07/07/2021 CLINICAL DATA:  Chest pain and shortness of breath. EXAM: CHEST - 2 VIEW COMPARISON:  None. FINDINGS: Midline trachea. Normal heart size and mediastinal contours. No pleural fluid. A large, approximately 50% left-sided pneumothorax identified superiorly and laterally. No mediastinal  shift. Clear lungs. IMPRESSION: Large left-sided pneumothorax. Critical test results telephoned to .Dr. Leonides Schanz. At the time of interpretation at 7:16 a.m.On 07/07/2021. Electronically Signed   By: Abigail Miyamoto M.D.   On: 07/07/2021 07:18   CT Angio Chest PE W/Cm &/Or Wo Cm  Result Date: 07/23/2021 CLINICAL DATA:  PE suspected, high probability. Additional provided: Shortness of breath and pain to chest, pale in color, difficulty speaking. Taking shallow breaths. Skin color pale. Recent collapse lung. EXAM: CT ANGIOGRAPHY CHEST WITH CONTRAST TECHNIQUE: Multidetector CT imaging of the chest was performed using the standard protocol during bolus administration of intravenous contrast. Multiplanar CT image reconstructions and MIPs were obtained to evaluate the vascular anatomy. CONTRAST:  53m OMNIPAQUE IOHEXOL 350 MG/ML SOLN COMPARISON:  Prior chest radiographs 07/23/2021 and earlier. FINDINGS: CARDIOVASCULAR: Heart size within normal limits.No pericardial effusion. No significant vascular finding. Satisfactory opacification of the pulmonary arteries to the segmental level.No evidence of pulmonary embolus. MEDIASTINUM/NODES: No mediastinal, hilar or axillary lymphadenopathy. Visualized thyroid gland unremarkable. LUNGS/PLEURA: Small residual/recurrent left pneumothorax at the apex, and with an additional component more inferiorly. No airspace consolidation. Linear opacity within the inferior left upper lobe laterally, compatible with atelectasis or scarring. Central airways grossly patent. UPPER ABDOMEN: No acute or significant finding. MUSCULOSKELETAL: No acute fracture or aggressive osseous  lesion. The chest wall is unremarkable. Review of the MIP images confirms the above findings. These results were called by telephone at the time of interpretation on 07/23/2021 at 6:23 pm to provider [facility] , who verbally acknowledged these results. IMPRESSION: Small residual/recurrent left pneumothorax at the apex, and  with an additional component more inferiorly. No evidence of pulmonary embolism. Linear opacity within the inferior left upper lobe, compatible with atelectasis or scarring. Electronically Signed   By: Kellie Simmering D.O.   On: 07/23/2021 18:31   DG Chest Port 1 View  Result Date: 07/23/2021 CLINICAL DATA:  Shortness of breath and chest pain. EXAM: PORTABLE CHEST 1 VIEW COMPARISON:  Radiographs 07/11/2021 FINDINGS: Previous left apical pneumothorax is not definitively seen on the current exam. Improvement in the left basilar opacity with minimal subsegmental atelectasis or scar in the left mid lung. Improved left lung volume loss. No significant pleural effusion. The heart is normal in size with normal mediastinal contours. No acute osseous abnormalities are seen. IMPRESSION: 1. Previous left apical pneumothorax is not definitively seen on the current exam. 2. Improvement in left basilar opacity with minimal subsegmental atelectasis or scar in the left mid lung. Electronically Signed   By: Keith Rake M.D.   On: 07/23/2021 17:39   DG Chest Port 1 View  Result Date: 07/11/2021 CLINICAL DATA:  Left chest tube removal EXAM: PORTABLE CHEST 1 VIEW COMPARISON:  07/11/2021 FINDINGS: Single frontal view of the chest demonstrates interval removal of the left chest tube. The trace left apical pneumothorax seen on the preceding exam is unchanged, volume estimated less than 5%. Persistent left basilar consolidation and small left effusion. The right chest is clear. There are no acute bony abnormalities. IMPRESSION: 1. Stable trace left apical pneumothorax after left chest tube removal. Volume estimated less than 5%. 2. Stable left basilar consolidation and small left pleural effusion. Electronically Signed   By: Randa Ngo M.D.   On: 07/11/2021 15:51   DG Chest Port 1 View  Result Date: 07/08/2021 CLINICAL DATA:  Chest tube, pneumothorax EXAM: PORTABLE CHEST 1 VIEW COMPARISON:  07/08/2021 FINDINGS: Left small  bore chest tube remains in place. Left pneumothorax slightly enlarged since prior study, now approximately 10%. Left mid lung atelectasis. Right lung clear. Heart is normal size. No acute bony abnormality. IMPRESSION: Left small bore chest tube remains in place with slight increased size of the left pneumothorax. Electronically Signed   By: Rolm Baptise M.D.   On: 07/08/2021 18:21   DG Chest Portable 1 View  Result Date: 07/07/2021 CLINICAL DATA:  Left-sided pneumothorax status post chest tube placement. EXAM: PORTABLE CHEST 1 VIEW COMPARISON:  Chest radiograph performed the same day. FINDINGS: The heart size and mediastinal contours are within normal limits. There has been interval placement of a left-sided thoracostomy tube with tip overlying the left mid lung. The left-sided pneumothorax has significantly decreased in size, now small in volume. There is mild left midlung atelectasis. The right lung is clear. There is no pleural effusion or right pneumothorax. The visualized skeletal structures are unremarkable. IMPRESSION: Interval decreased size of a left pneumothorax after chest tube placement, now small in volume. Electronically Signed   By: Zerita Boers M.D.   On: 07/07/2021 09:22   ECHOCARDIOGRAM COMPLETE  Result Date: 07/10/2021    ECHOCARDIOGRAM REPORT   Patient Name:   Wyatt Nelson Date of Exam: 07/10/2021 Medical Rec #:  [id number]     Height:       67.0 in Accession #:  [id number]    Weight:       145.0 lb Date of Birth:  [birth year]      BSA:          1.764 m Patient Age:    25 years      BP:           139/79 mmHg Patient Gender: M             HR:           85 bpm. Exam Location:  [facility] Procedure: 2D Echo, Color Doppler, Cardiac Doppler and Saline Contrast Bubble            Study Indications:     R01.1 Murmur  History:         Patient has no prior history of Echocardiogram examinations.                  Signs/Symptoms:Chest Pain and Shortness of Breath; Risk                  Factors:Current  Smoker.  Sonographer:     Charmayne Sheer Referring Phys:  [id number] Loletha Grayer Diagnosing Phys: Yolonda Kida MD IMPRESSIONS  1. Left ventricular ejection fraction, by estimation, is 70 to 75%. The left ventricle has hyperdynamic function. The left ventricle has no regional wall motion abnormalities. Left ventricular diastolic parameters were normal.  2. Right ventricular systolic function is normal. The right ventricular size is normal.  3. Left atrial size was mildly dilated.  4. Right atrial size was mildly dilated.  5. The mitral valve is normal in structure. Trivial mitral valve regurgitation.  6. The aortic valve is normal in structure. Aortic valve regurgitation is not visualized. Conclusion(s)/Recommendation(s): Normal biventricular function without evidence of hemodynamically significant valvular heart disease. FINDINGS  Left Ventricle: Left ventricular ejection fraction, by estimation, is 70 to 75%. The left ventricle has hyperdynamic function. The left ventricle has no regional wall motion abnormalities. The left ventricular internal cavity size was normal in size. There is borderline left ventricular hypertrophy. Left ventricular diastolic parameters were normal. Right Ventricle: The right ventricular size is normal. No increase in right ventricular wall thickness. Right ventricular systolic function is normal. Left Atrium: Left atrial size was mildly dilated. Right Atrium: Right atrial size was mildly dilated. Pericardium: There is no evidence of pericardial effusion. Mitral Valve: The mitral valve is normal in structure. Trivial mitral valve regurgitation. MV peak gradient, 5.4 mmHg. The mean mitral valve gradient is 2.0 mmHg. Tricuspid Valve: The tricuspid valve is normal in structure. Tricuspid valve regurgitation is mild. Aortic Valve: The aortic valve is normal in structure. Aortic valve regurgitation is not visualized. Aortic valve mean gradient measures 5.0 mmHg. Aortic valve peak gradient  measures 9.0 mmHg. Aortic valve area, by VTI measures 2.84 cm. Pulmonic Valve: The pulmonic valve was normal in structure. Pulmonic valve regurgitation is not visualized. Aorta: The ascending aorta was not well visualized. IAS/Shunts: No atrial level shunt detected by color flow Doppler. Agitated saline contrast was given intravenously to evaluate for intracardiac shunting.  LEFT VENTRICLE PLAX 2D LVIDd:         4.10 cm  Diastology LVIDs:         2.20 cm  LV e' medial:    10.30 cm/s LV PW:         0.80 cm  LV E/e' medial:  8.5 LV IVS:        0.60 cm  LV e' lateral:   15.80  cm/s LVOT diam:     1.90 cm  LV E/e' lateral: 5.6 LV SV:         68 LV SV Index:   39 LVOT Area:     2.84 cm  RIGHT VENTRICLE RV Basal diam:  3.80 cm TAPSE (M-mode): 3.0 cm LEFT ATRIUM             Index       RIGHT ATRIUM          Index LA diam:        2.70 cm 1.53 cm/m  RA Area:     9.47 cm LA Vol (A2C):   24.7 ml 14.00 ml/m RA Volume:   17.80 ml 10.09 ml/m LA Vol (A4C):   27.0 ml 15.31 ml/m LA Biplane Vol: 26.2 ml 14.85 ml/m  AORTIC VALVE                    PULMONIC VALVE AV Area (Vmax):    2.72 cm     PV Vmax:       1.35 m/s AV Area (Vmean):   2.70 cm     PV Vmean:      95.900 cm/s AV Area (VTI):     2.84 cm     PV VTI:        0.248 m AV Vmax:           150.00 cm/s  PV Peak grad:  7.3 mmHg AV Vmean:          108.000 cm/s PV Mean grad:  4.0 mmHg AV VTI:            0.241 m AV Peak Grad:      9.0 mmHg AV Mean Grad:      5.0 mmHg LVOT Vmax:         144.00 cm/s LVOT Vmean:        103.000 cm/s LVOT VTI:          0.241 m LVOT/AV VTI ratio: 1.00  AORTA Ao Root diam: 2.60 cm MITRAL VALVE MV Area (PHT): 4.31 cm    SHUNTS MV Area VTI:   2.92 cm    Systemic VTI:  0.24 m MV Peak grad:  5.4 mmHg    Systemic Diam: 1.90 cm MV Mean grad:  2.0 mmHg MV Vmax:       1.16 m/s MV Vmean:      74.6 cm/s MV Decel Time: 176 msec MV E velocity: 87.80 cm/s MV A velocity: 89.10 cm/s MV E/A ratio:  0.99 Dwayne D Callwood MD Electronically signed by Yolonda Kida MD Signature Date/Time: 07/10/2021/1:39:40 PM    Final       Labs: BNP (last 3 results) No results for input(s): BNP in the last 8760 hours. Basic Metabolic Panel: Recent Labs  Lab 07/23/21 1711 07/24/21 0526  NA 136 135  K 3.9 3.8  CL 101 103  CO2 31 26  GLUCOSE 96 95  BUN 17 18  CREATININE 0.79 0.82  CALCIUM 9.1 8.7*   Liver Function Tests: Recent Labs  Lab 07/23/21 1711  AST 21  ALT 19  ALKPHOS 71  BILITOT 0.8  PROT 8.3*  ALBUMIN 4.3   Recent Labs  Lab 07/23/21 1711  LIPASE 23   No results for input(s): AMMONIA in the last 168 hours. CBC: Recent Labs  Lab 07/23/21 1711  WBC 8.0  HGB 15.4  HCT 44.2  MCV 91.1  PLT 263   Cardiac Enzymes: No results  for input(s): CKTOTAL, CKMB, CKMBINDEX, TROPONINI in the last 168 hours. BNP: Invalid input(s): POCBNP CBG: No results for input(s): GLUCAP in the last 168 hours. D-Dimer No results for input(s): DDIMER in the last 72 hours. Hgb A1c No results for input(s): HGBA1C in the last 72 hours. Lipid Profile No results for input(s): CHOL, HDL, LDLCALC, TRIG, CHOLHDL, LDLDIRECT in the last 72 hours. Thyroid function studies No results for input(s): TSH, T4TOTAL, T3FREE, THYROIDAB in the last 72 hours.  Invalid input(s): FREET3 Anemia work up No results for input(s): VITAMINB12, FOLATE, FERRITIN, TIBC, IRON, RETICCTPCT in the last 72 hours. Urinalysis No results found for: COLORURINE, APPEARANCEUR, LABSPEC, [place], GLUCOSEU, HGBUR, BILIRUBINUR, KETONESUR, PROTEINUR, UROBILINOGEN, NITRITE, LEUKOCYTESUR Sepsis Labs Invalid input(s): PROCALCITONIN,  WBC,  LACTICIDVEN Microbiology No results found for this or any previous visit (from the past 240 hour(s)).   Total time spend on discharging this patient, including the last patient exam, discussing the hospital stay, instructions for ongoing care as it relates to all pertinent caregivers, as well as preparing the medical discharge records, prescriptions,  and/or referrals as applicable, is 50 minutes.    Enzo Bi, MD  [facility] 07/27/2021, 3:05 AM

## 2021-07-27 NOTE — Progress Notes (Signed)
[facility] NOTE  07/27/2021  History of Present Illness: Wyatt Nelson is a 25 y.o. male well known to our service following admission from 08/20 - 08/24 for spontaneous left pneumothorax which was managed with chest tube. He did have to present to the ED on 09/05 and was observed secondary to continued left chest pain. He underwent CTA which showed small residual apical pneumothorax on the left. Since discharge home, he reports that the pain in his chest has resolved and his breathing has returned to baseline. No longer needing any pain medications. No fever, chills, cough. He has ceased smoking. No other complaints.   Past Medical History: Past Medical History:  Diagnosis Date   Medical history non-contributory      Past Surgical History: Past Surgical History:  Procedure Laterality Date   NO PAST SURGERIES      Home Medications: Prior to Admission medications   Medication Sig Start Date End Date Taking? Authorizing Provider  cyclobenzaprine (FLEXERIL) 10 MG tablet Take 1 tablet (10 mg total) by mouth 3 (three) times daily as needed for up to 7 days for muscle spasms. 07/24/21 07/31/21 Yes Darlin Priestly, MD  ibuprofen (ADVIL) 600 MG tablet Take 1 tablet (600 mg total) by mouth 3 (three) times daily for 7 days. 07/24/21 07/31/21 Yes Darlin Priestly, MD  nicotine (NICODERM CQ - DOSED IN MG/24 HOURS) 21 mg/24hr patch Place 1 patch (21 mg total) onto the skin daily. 07/11/21 08/10/21 Yes Tresa Moore, MD    Allergies: No Known Allergies  Review of Systems: Review of Systems  Constitutional:  Negative for chills and fever.  HENT:  Negative for congestion and sore throat.   Respiratory:  Negative for cough and shortness of breath.   Cardiovascular:  Negative for chest pain and palpitations.  Gastrointestinal:  Negative for abdominal pain, diarrhea, nausea and vomiting.  All other systems reviewed and are negative.  Physical Exam BP 118/70   Pulse 80   Temp  98 F (36.7 C)   Ht 5\' 9"  (1.753 m)   Wt 120 lb (54.4 kg)   SpO2 97%   BMI 17.72 kg/m   Physical Exam Vitals and nursing note reviewed. Exam conducted with a chaperone present.  Constitutional:      General: He is not in acute distress.    Appearance: Normal appearance. He is normal weight. He is not ill-appearing.  HENT:     Head: Normocephalic and atraumatic.  Eyes:     Conjunctiva/sclera: Conjunctivae normal.     Pupils: Pupils are equal, round, and reactive to light.  Cardiovascular:     Rate and Rhythm: Normal rate and regular rhythm.     Pulses: Normal pulses.  Pulmonary:     Effort: Pulmonary effort is normal. No respiratory distress.     Breath sounds: Normal breath sounds.  Genitourinary:    Comments: Deferred Musculoskeletal:     Right lower leg: No edema.     Left lower leg: No edema.  Skin:    General: Skin is warm and dry.     Coloration: Skin is not pale.     Findings: No erythema.  Neurological:     General: No focal deficit present.     Mental Status: He is alert and oriented to person, place, and time.  Psychiatric:        Mood and Affect: Mood normal.        Behavior: Behavior normal.    Labs/Imaging:  CTA Chest (07/23/2021) personally  reviewed which shows small left apical PTX otherwise unremarkable, and radiologist report reviewed below:  IMPRESSION: Small residual/recurrent left pneumothorax at the apex, and with an additional component more inferiorly.   No evidence of pulmonary embolism.   Linear opacity within the inferior left upper lobe, compatible with atelectasis or scarring.   Assessment and Plan: This is a 25 y.o. male resolved left spontaneous pneumothorax   - Nothing further from surgical perspective  - Reinforced smoking cessation  - He can follow up on as needed basis  Face-to-face time spent with the patient and care providers was 20 minutes, with more than 50% of the time spent counseling, educating, and coordinating  care of the patient.     Lynden Oxford, PA-C [facility] 07/27/2021, 1:14 PM [phone] M-F: 7am - 4pm

## 2021-07-27 NOTE — Patient Instructions (Signed)
   Follow-up with our office as needed.  Please call and ask to speak with a nurse if you develop questions or concerns.  

## 2021-07-28 ENCOUNTER — Emergency Department: Payer: Self-pay

## 2021-07-28 ENCOUNTER — Other Ambulatory Visit: Payer: Self-pay

## 2021-07-28 ENCOUNTER — Emergency Department
Admission: EM | Admit: 2021-07-28 | Discharge: 2021-07-28 | Disposition: A | Discharge status: Home / Self Care | Payer: Self-pay | Attending: Emergency Medicine | Admitting: Emergency Medicine

## 2021-07-28 DIAGNOSIS — R1010 Upper abdominal pain, unspecified: Secondary | ICD-10-CM | POA: Insufficient documentation

## 2021-07-28 DIAGNOSIS — F17210 Nicotine dependence, cigarettes, uncomplicated: Secondary | ICD-10-CM | POA: Insufficient documentation

## 2021-07-28 DIAGNOSIS — R0789 Other chest pain: Secondary | ICD-10-CM | POA: Insufficient documentation

## 2021-07-28 DIAGNOSIS — R002 Palpitations: Secondary | ICD-10-CM | POA: Insufficient documentation

## 2021-07-28 DIAGNOSIS — R059 Cough, unspecified: Secondary | ICD-10-CM | POA: Insufficient documentation

## 2021-07-28 LAB — COMPREHENSIVE METABOLIC PANEL
ALT: 14 U/L (ref 0–44)
AST: 20 U/L (ref 15–41)
Albumin: 4.3 g/dL (ref 3.5–5.0)
Alkaline Phosphatase: 62 U/L (ref 38–126)
Anion gap: 8 (ref 5–15)
BUN: 15 mg/dL (ref 6–20)
CO2: 28 mmol/L (ref 22–32)
Calcium: 9.2 mg/dL (ref 8.9–10.3)
Chloride: 102 mmol/L (ref 98–111)
Creatinine, Ser: 0.76 mg/dL (ref 0.61–1.24)
GFR, Estimated: >60 mL/min (ref >60)
Glucose, Bld: 89 mg/dL (ref 70–99)
Potassium: 3.6 mmol/L (ref 3.5–5.1)
Sodium: 138 mmol/L (ref 135–145)
Total Bilirubin: 0.9 mg/dL (ref 0.3–1.2)
Total Protein: 8 g/dL (ref 6.5–8.1)

## 2021-07-28 LAB — CBC
HCT: 41.2 % (ref 39.0–52.0)
Hemoglobin: 14.3 g/dL (ref 13.0–17.0)
MCH: 31.2 pg (ref 26.0–34.0)
MCHC: 34.7 g/dL (ref 30.0–36.0)
MCV: 90 fL (ref 80.0–100.0)
Platelets: 252 10*3/uL (ref 150–400)
RBC: 4.58 MIL/uL (ref 4.22–5.81)
RDW: 11.9 % (ref 11.5–15.5)
WBC: 8.2 10*3/uL (ref 4.0–10.5)
nRBC: 0 % (ref 0.0–0.2)

## 2021-07-28 LAB — TROPONIN I (HIGH SENSITIVITY): Troponin I (High Sensitivity): <2 ng/L (ref <18)

## 2021-07-28 MED ORDER — IOHEXOL 350 MG/ML SOLN
75.0000 mL | Freq: Once | INTRAVENOUS | Status: AC | PRN
  Administered 2021-07-28: 75 mL via INTRAVENOUS

## 2021-07-28 MED ORDER — KETOROLAC TROMETHAMINE 30 MG/ML IJ SOLN
30.0000 mg | Freq: Once | INTRAMUSCULAR | Status: AC
  Administered 2021-07-28: 30 mg via INTRAVENOUS
  Filled 2021-07-28: qty 1

## 2021-07-28 NOTE — Discharge Instructions (Addendum)
You may alternate Tylenol 1000 mg every 6 hours as needed for pain, fever and Ibuprofen 800 mg every 8 hours as needed for pain, fever.  Please take Ibuprofen with food.  Do not take more than 4000 mg of Tylenol (acetaminophen) in a 24 hour period.   Steps to find a Primary Care Provider (PCP):  Call [phone] or [phone] to access "[facility] Find a Doctor Service."  2.  You may also go on the [facility] website at [email or web address]  

## 2021-07-28 NOTE — ED Triage Notes (Addendum)
Information obtained with assist of arabic interpreter shalim # [id number]. Pt states he had a pneumothorax with a chest tube a week ago. Pt  states is now having pain in his upper back and left chest tonight, same area he had the pneumothorax. Pt denies shob. Pt states area is "pulsing". Pt is also pointing to luq of abd when describing "pulsing" sensation as well. Pt appears in no acute distress.

## 2021-07-28 NOTE — ED Provider Notes (Signed)
[facility] Emergency Department Provider Note  ____________________________________________   Event Date/Time   First MD Initiated Contact with Patient 07/28/21 [id number]     (approximate)  I have reviewed the triage vital signs and the nursing notes.   HISTORY  Chief Complaint Abdominal Pain    HPI Wyatt Nelson is a 25 y.o. male with history of spontaneous pneumothorax who presents to the emergency department with complaints of throbbing left-sided chest pain for the past week.  States this pain feels different than the pain he had with his pneumothorax but feels similar to pain he had when his chest tube was placed.  States it hurts to breathe and he feels palpitations with deep breaths.  No fevers.  Has had a mild cough.  Patient was admitted to the hospital 07/07/2021 -07/11/2021 due to spontaneous pneumothorax requiring chest tube.  Patient was readmitted to the hospital 07/23/2021 -07/24/2021 for residual apical pneumothorax seen on CT imaging.    Arabic interpreter used.    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 Authorizing Provider  cyclobenzaprine (FLEXERIL) 10 MG tablet Take 1 tablet (10 mg total) by mouth 3 (three) times daily as needed for up to 7 days for muscle spasms. 07/24/21 07/31/21  Darlin Priestly, MD  ibuprofen (ADVIL) 600 MG tablet Take 1 tablet (600 mg total) by mouth 3 (three) times daily for 7 days. 07/24/21 07/31/21  Darlin Priestly, MD   nicotine (NICODERM CQ - DOSED IN MG/24 HOURS) 21 mg/24hr patch Place 1 patch (21 mg total) onto the skin daily. 07/11/21 08/10/21  Tresa Moore, MD    Allergies Patient has no known allergies.  Family History  Problem Relation Age of Onset   Healthy Mother    Healthy Father     Social History Social History   Tobacco Use   Smoking status: Every Day    Packs/day: 1.00    Types: Cigarettes   Smokeless tobacco: Never  Vaping Use   Vaping Use: Never used  Substance Use Topics   Alcohol use: Not Currently   Drug use: Never    Review of Systems Constitutional: No fever. Eyes: No visual changes. ENT: No sore throat. Cardiovascular: Denies chest pain. Respiratory: Denies shortness of breath. Gastrointestinal: No nausea, vomiting, diarrhea. Genitourinary: Negative for dysuria. Musculoskeletal: Negative for back pain. Skin: Negative for rash. Neurological: Negative for focal weakness or numbness.  ____________________________________________   PHYSICAL EXAM:  VITAL SIGNS: ED Triage Vitals [07/28/21 0323]  Enc Vitals Group     BP (!) 128/95     Pulse Rate 84     Resp 16     Temp 98.7 F (37.1 C)     Temp src      SpO2 98 %     Weight 120 lb (54.4 kg)     Height 5\' 5"  (1.651 m)     Head Circumference  Peak Flow      Pain Score 0     Pain Loc      Pain Edu?      Excl. in GC?    CONSTITUTIONAL: Alert and oriented and responds appropriately to questions. Well-appearing; well-nourished HEAD: Normocephalic EYES: Conjunctivae clear, pupils appear equal, EOM appear intact ENT: normal nose; moist mucous membranes NECK: Supple, normal ROM CARD: RRR; S1 and S2 appreciated; no murmurs, no clicks, no rubs, no gallops CHEST:  Chest wall is tender to palpation.  Site from previous chest tube has completely healed with no surrounding redness, warmth, ecchymosis, soft tissue swelling.  No crepitus, ecchymosis, erythema, warmth, rash or other lesions present.    RESP: Normal chest excursion without splinting or tachypnea; breath sounds clear and equal bilaterally; no wheezes, no rhonchi, no rales, no hypoxia or respiratory distress, speaking full sentences ABD/GI: Normal bowel sounds; non-distended; soft, non-tender, no rebound, no guarding, no peritoneal signs, no hepatosplenomegaly BACK: The back appears normal EXT: Normal ROM in all joints; no deformity noted, no edema; no cyanosis, no calf tenderness or calf swelling SKIN: Normal color for age and race; warm; no rash on exposed skin NEURO: Moves all extremities equally PSYCH: The patient's mood and manner are appropriate.  ____________________________________________   LABS (all labs ordered are listed, but only abnormal results are displayed)  Labs Reviewed  CBC  COMPREHENSIVE METABOLIC PANEL  TROPONIN I (HIGH SENSITIVITY)   ____________________________________________  EKG   EKG Interpretation  Date/Time:  Saturday July 28 2021 03:26:59 EDT Ventricular Rate:  78 PR Interval:  166 QRS Duration: 100 QT Interval:  330 QTC Calculation: 376 R Axis:   83 Text Interpretation: Normal sinus rhythm Normal ECG Confirmed by Rochele Raring [phone]) on 07/28/2021 4:29:17 AM        ____________________________________________  RADIOLOGY Normajean Baxter Roddie Riegler, personally viewed and evaluated these images (plain radiographs) as part of my medical decision making, as well as reviewing the written report by the radiologist.  ED MD interpretation: CTA chest shows no PE.  There is a tiny left apical and anterior pneumothorax that is stable and unchanged since CT on 07/23/2021.  Official radiology report(s): DG Chest 2 View  Result Date: 07/28/2021 CLINICAL DATA:  Recent pneumothorax and shortness of breath. EXAM: CHEST - 2 VIEW COMPARISON:  Chest radiograph dated 07/23/2021. FINDINGS: No focal consolidation, pleural effusion, or pneumothorax. Faint linear lucency along the left hilum may be  artifactual or represent a minimal pneumomediastinum. The cardiac silhouette is within limits. No acute osseous pathology. IMPRESSION: 1. No active cardiopulmonary disease. 2. Artifact versus trace pneumomediastinum. Electronically Signed   By: Elgie Collard M.D.   On: 07/28/2021 03:57   CT Angio Chest PE W and/or Wo Contrast  Result Date: 07/28/2021 CLINICAL DATA:  Recent pneumothorax with chest tube. Back and left chest pain tonight. Rule out pulmonary embolism. EXAM: CT ANGIOGRAPHY CHEST WITH CONTRAST TECHNIQUE: Multidetector CT imaging of the chest was performed using the standard protocol during bolus administration of intravenous contrast. Multiplanar CT image reconstructions and MIPs were obtained to evaluate the vascular anatomy. CONTRAST:  10mL OMNIPAQUE IOHEXOL 350 MG/ML SOLN COMPARISON:  Plain film earlier today.  CTA chest 07/23/2021. FINDINGS: Cardiovascular: The quality of this exam for evaluation of pulmonary embolism is good. No evidence of pulmonary embolism. Normal aortic caliber. Normal heart size, without pericardial effusion. Mediastinum/Nodes: No mediastinal or definite hilar adenopathy, given limitations of unenhanced CT. Minimal residual thymic tissue in the anterior mediastinum. Lungs/Pleura: No pleural fluid. Residual tiny  left apical pneumothorax on 11/06, similar to on the prior CT. An anterior inferior component including on 74/6 is also similar. Mild centrilobular emphysema. Upper Abdomen: Normal imaged portions of the liver, spleen, stomach, pancreas, adrenal glands, kidneys. Musculoskeletal: No acute osseous abnormality. Review of the MIP images confirms the above findings. IMPRESSION: 1.  No evidence of pulmonary embolism. 2. Since the CT of 07/23/2021, similar tiny left apical and anterior pneumothorax. 3.  Emphysema (ICD10-J43.9). Electronically Signed   By: Jeronimo Greaves M.D.   On: 07/28/2021 06:14     ____________________________________________   PROCEDURES  Procedure(s) performed (including Critical Care):  Procedures    ____________________________________________   INITIAL IMPRESSION / ASSESSMENT AND PLAN / ED COURSE  As part of my medical decision making, I reviewed the following data within the electronic MEDICAL RECORD NUMBER Nursing notes reviewed and incorporated, Interpreter needed, Labs reviewed , EKG interpreted , Old EKG reviewed, Old chart reviewed, Radiograph reviewed , CT reviewed, and Notes from prior ED visits         Patient here with continued chest pain after recent spontaneous pneumothorax.  Was readmitted to the hospital after he was found to have residual apical pneumothorax but did not require another chest tube.  They thought at that time his pain was due to costochondritis.  He states he was discharged with pain medication.  Now complaining of worsening pain and palpitations.  He is tender to palpation diffusely of the chest wall.  This could be musculoskeletal in nature.  He did recently have a CTA that showed no PE but given he was readmitted after this, discussed with patient that he would be high risk for PE because of his hospitalization.  We will proceed with repeat CTA of the chest for further evaluation.  Chest x-ray here shows no acute abnormality.  EKG nonischemic.  Troponin pending.  Doubt ACS, dissection.  Will give Toradol for pain control.  ED PROGRESS  Patient's pain is well controlled.  He is resting comfortably.  Sats 99% on room air.  No hypoxia, respiratory distress.  CT scan shows no PE, infiltrate, edema, worsening pneumothorax.  He still has a very tiny apical pneumothorax that is unchanged compared to his CT 5 days ago.  He does not need readmission for this.  I discussed these findings with patient and recommend close follow-up with a primary care doctor.  I feel he is safe to alternate Tylenol and Motrin for pain.  He also had normal  inflammatory markers and had pericarditis ruled out during his last admission.  EKG is nonischemic, troponin negative.  I feel he is safe for discharge home.  He is comfortable with this plan.  I suspect that his pain is musculoskeletal in nature as it is reproducible with palpation.  His incision site from his previous chest tube is well-healed without signs of surrounding infection.   At this time, I do not feel there is any life-threatening condition present. I have reviewed, interpreted and discussed all results (EKG, imaging, lab, urine as appropriate) and exam findings with patient/family. I have reviewed nursing notes and appropriate previous records.  I feel the patient is safe to be discharged home without further emergent workup and can continue workup as an outpatient as needed. Discussed usual and customary return precautions. Patient/family verbalize understanding and are comfortable with this plan.  Outpatient follow-up has been provided as needed. All questions have been answered.  ____________________________________________   FINAL CLINICAL IMPRESSION(S) / ED DIAGNOSES  Final diagnoses:  Chest wall pain     ED Discharge Orders     None       *Please note:  Cherene JulianAhmed Haney was evaluated in Emergency Department on 07/28/2021 for the symptoms described in the history of present illness. He was evaluated in the context of the global COVID-19 pandemic, which necessitated consideration that the patient might be at risk for infection with the SARS-CoV-2 virus that causes COVID-19. Institutional protocols and algorithms that pertain to the evaluation of patients at risk for COVID-19 are in a state of rapid change based on information released by regulatory bodies including the CDC and federal and state organizations. These policies and algorithms were followed during the patient's care in the ED.  Some ED evaluations and interventions may be delayed as a result of limited staffing during  and the pandemic.*   Note:  This document was prepared using Dragon voice recognition software and may include unintentional dictation errors.    Luetta Piazza, Layla MawKristen N, DO 07/28/21 [phone]

## 2021-07-28 NOTE — ED Notes (Signed)
Patient transported to CT 

## 2022-09-21 ENCOUNTER — Emergency Department (HOSPITAL_COMMUNITY): Admission: EM | Admit: 2022-09-21 | Payer: MEDICAID | Source: Home / Self Care

## 2022-09-21 ENCOUNTER — Encounter (HOSPITAL_COMMUNITY): Payer: Self-pay | Admitting: Emergency Medicine

## 2022-09-21 ENCOUNTER — Emergency Department (HOSPITAL_COMMUNITY): Payer: MEDICAID

## 2022-09-21 HISTORY — DX: Essential (primary) hypertension: I10

## 2022-11-21 IMAGING — CT CT ANGIO CHEST
2 of 6 series · 18 of 46 positions shown · IV contrast (APPLIED)
Comparison: Plain film earlier today.  CTA chest 07/23/2021.

CLINICAL DATA: Recent pneumothorax with chest tube. Back and left
chest pain tonight. Rule out pulmonary embolism.

EXAM:
CT ANGIOGRAPHY CHEST WITH CONTRAST
TECHNIQUE: Multidetector CT imaging of the chest was performed using the
standard protocol during bolus administration of intravenous
contrast. Multiplanar CT image reconstructions and MIPs were
obtained to evaluate the vascular anatomy.
CONTRAST:  75mL OMNIPAQUE IOHEXOL 350 MG/ML SOLN

[Series 5: thins · axial · 0.74mm/px · z∈[-593,-316]mm · 15 of 379 slices shown]
[im 16/379  lung]
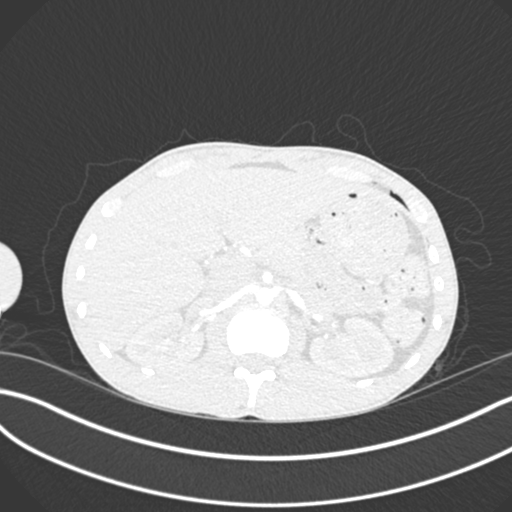
[im 48/379  soft-tissue]
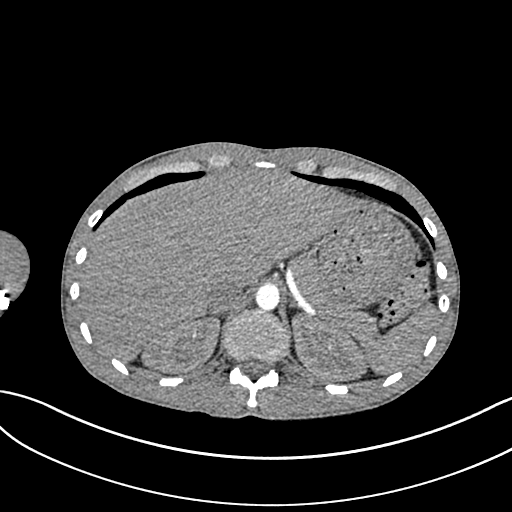
[im 64/379  lung]
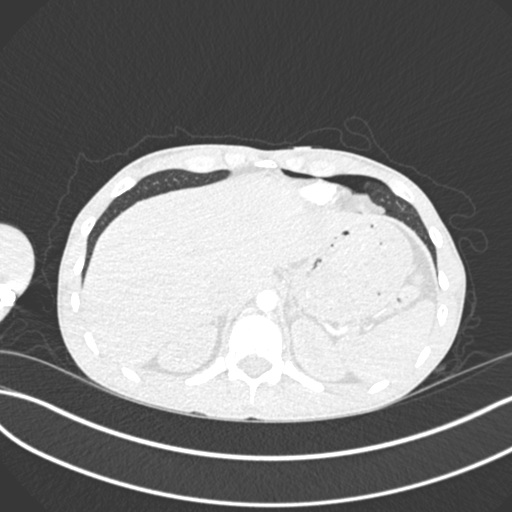
[im 95/379  soft-tissue]
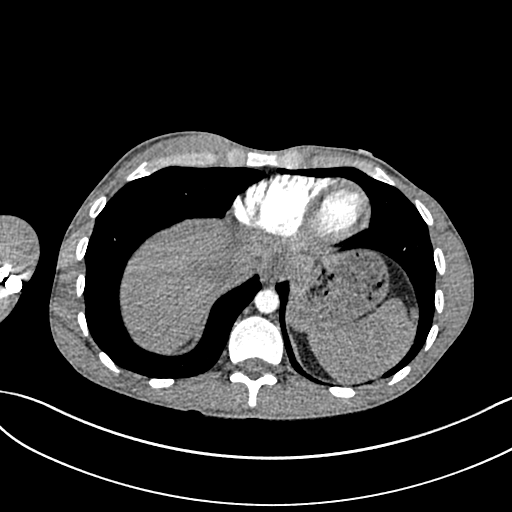
[im 111/379  lung]
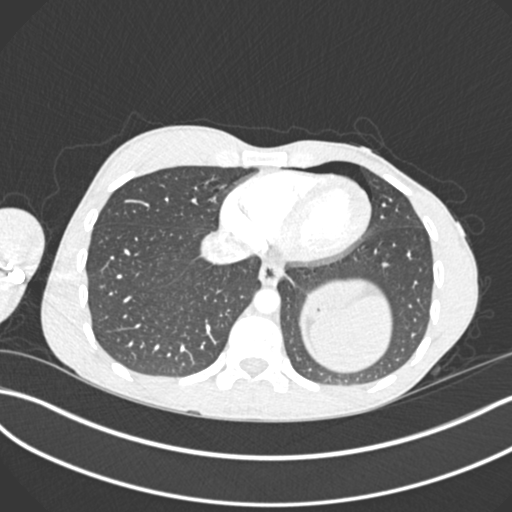
[im 142/379  soft-tissue]
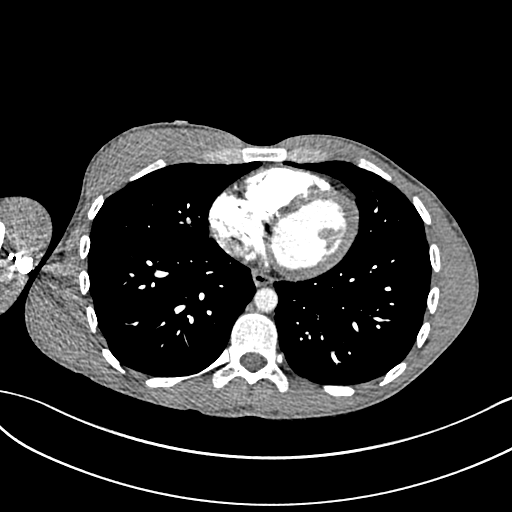
[im 158/379  lung]
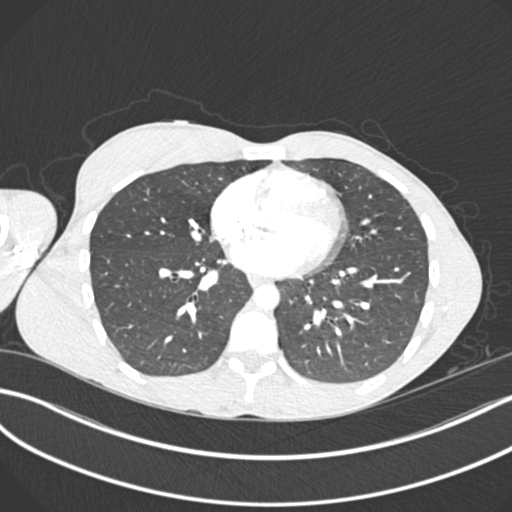
[im 190/379  soft-tissue]
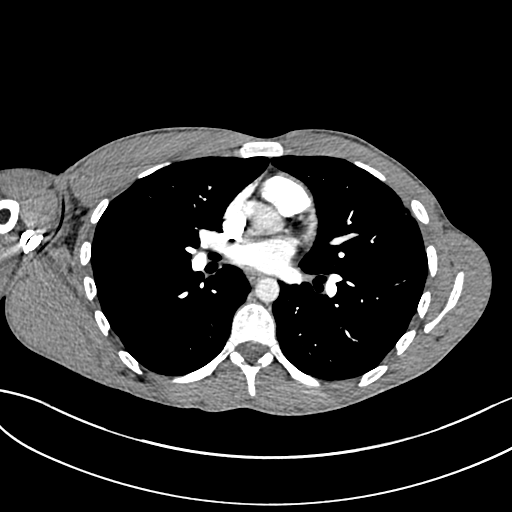
[im 221/379  lung]
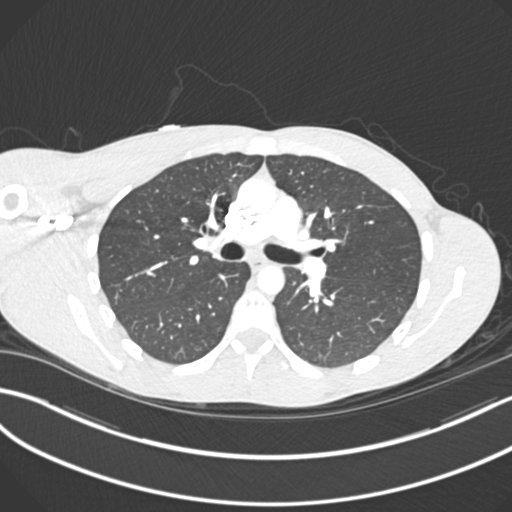
[im 237/379  soft-tissue]
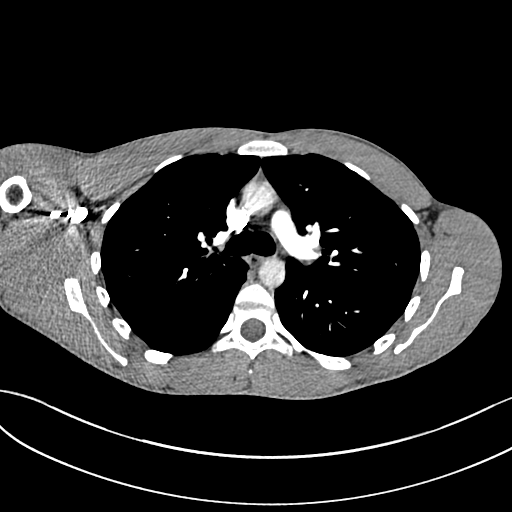
[im 268/379  lung]
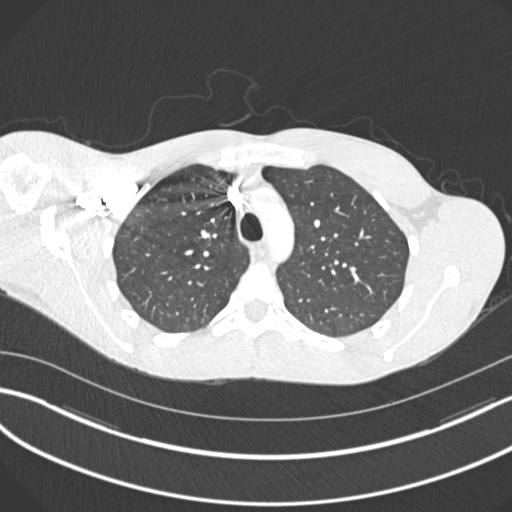
[im 284/379  soft-tissue]
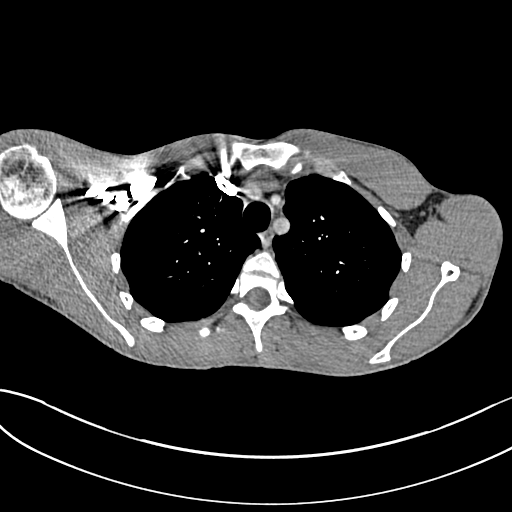
[im 316/379  lung]
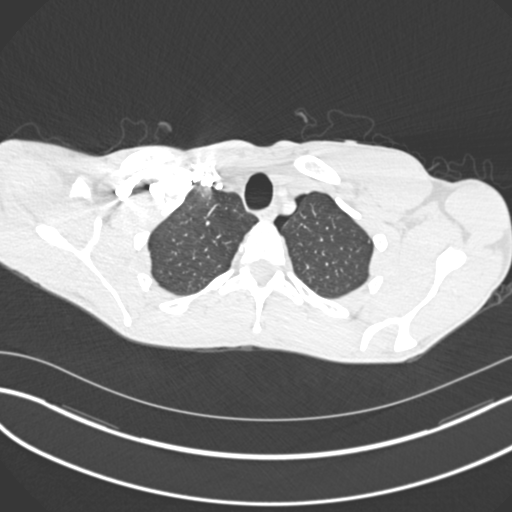
[im 331/379  soft-tissue]
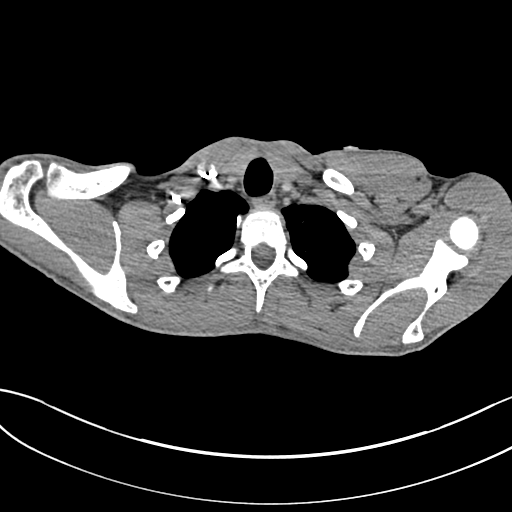
[im 363/379  lung]
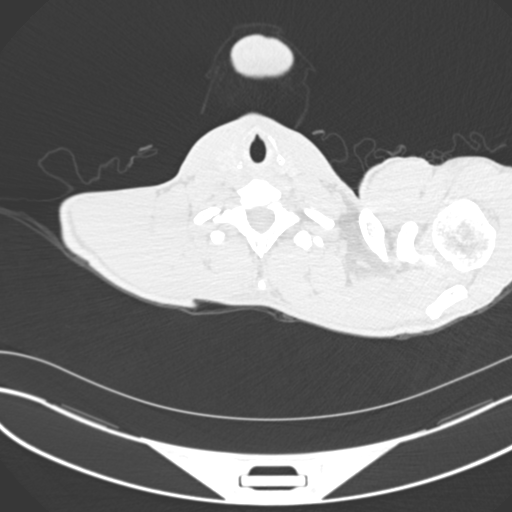

[Series 7: coronal mpr · coronal · 0.59mm/px · 3 of 106 slices shown]
[im 27/106  soft-tissue]
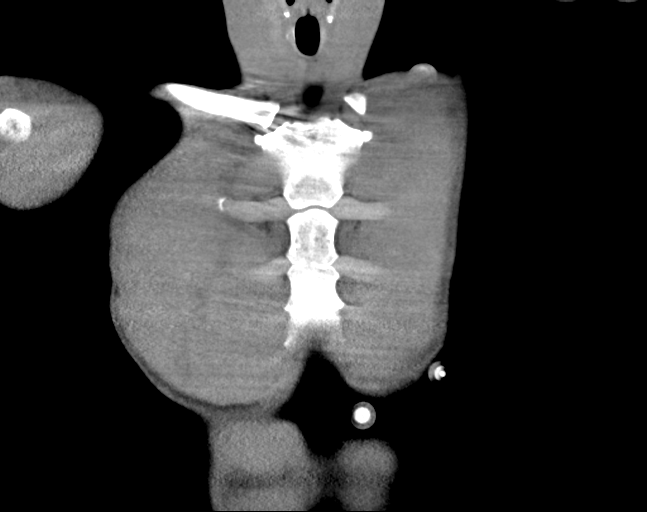
[im 53/106  soft-tissue]
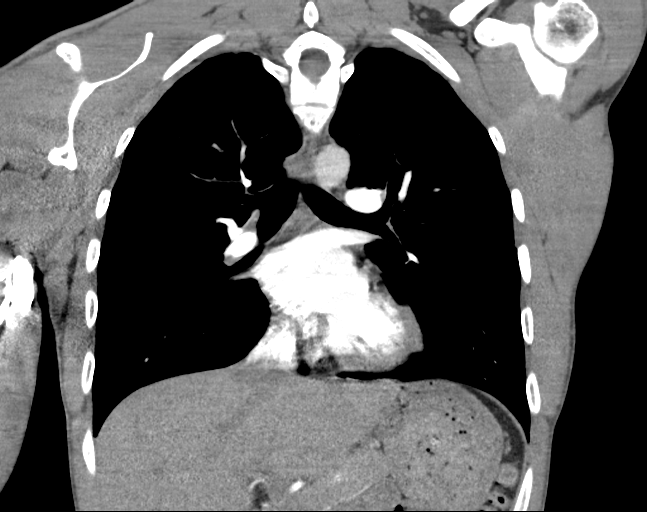
[im 79/106  soft-tissue]
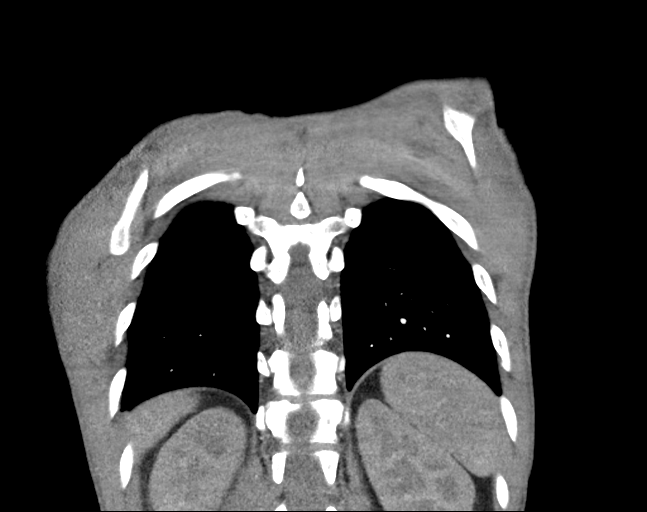

[18 of 46 positions shown; findings below may reference images not displayed]

FINDINGS: Cardiovascular: The quality of this exam for evaluation of pulmonary
embolism is good. No evidence of pulmonary embolism.

Normal aortic caliber. Normal heart size, without pericardial
effusion.

Mediastinum/Nodes: No mediastinal or definite hilar adenopathy,
given limitations of unenhanced CT. Minimal residual thymic tissue
in the anterior mediastinum.

Lungs/Pleura: No pleural fluid. Residual tiny left apical
pneumothorax on [DATE], similar to on the prior CT. An anterior
inferior component including on 74/6 is also similar.

Mild centrilobular emphysema.

Upper Abdomen: Normal imaged portions of the liver, spleen, stomach,
pancreas, adrenal glands, kidneys.

Musculoskeletal: No acute osseous abnormality.

Review of the MIP images confirms the above findings.
IMPRESSION: 1.  No evidence of pulmonary embolism.
2. Since the CT of 07/23/2021, similar tiny left apical and anterior
pneumothorax.
3.  Emphysema ([id number]).

## 2022-11-21 IMAGING — CR DG CHEST 2V
1 series · 2 of 2 positions shown · non-contrast
Comparison: Chest radiograph dated 07/23/2021.

CLINICAL DATA: Recent pneumothorax and shortness of breath.

EXAM:
CHEST - 2 VIEW

[Series 1: dg chest 2 view · 0.14mm/px · 2 of 2 slices shown]
[im 1/2]
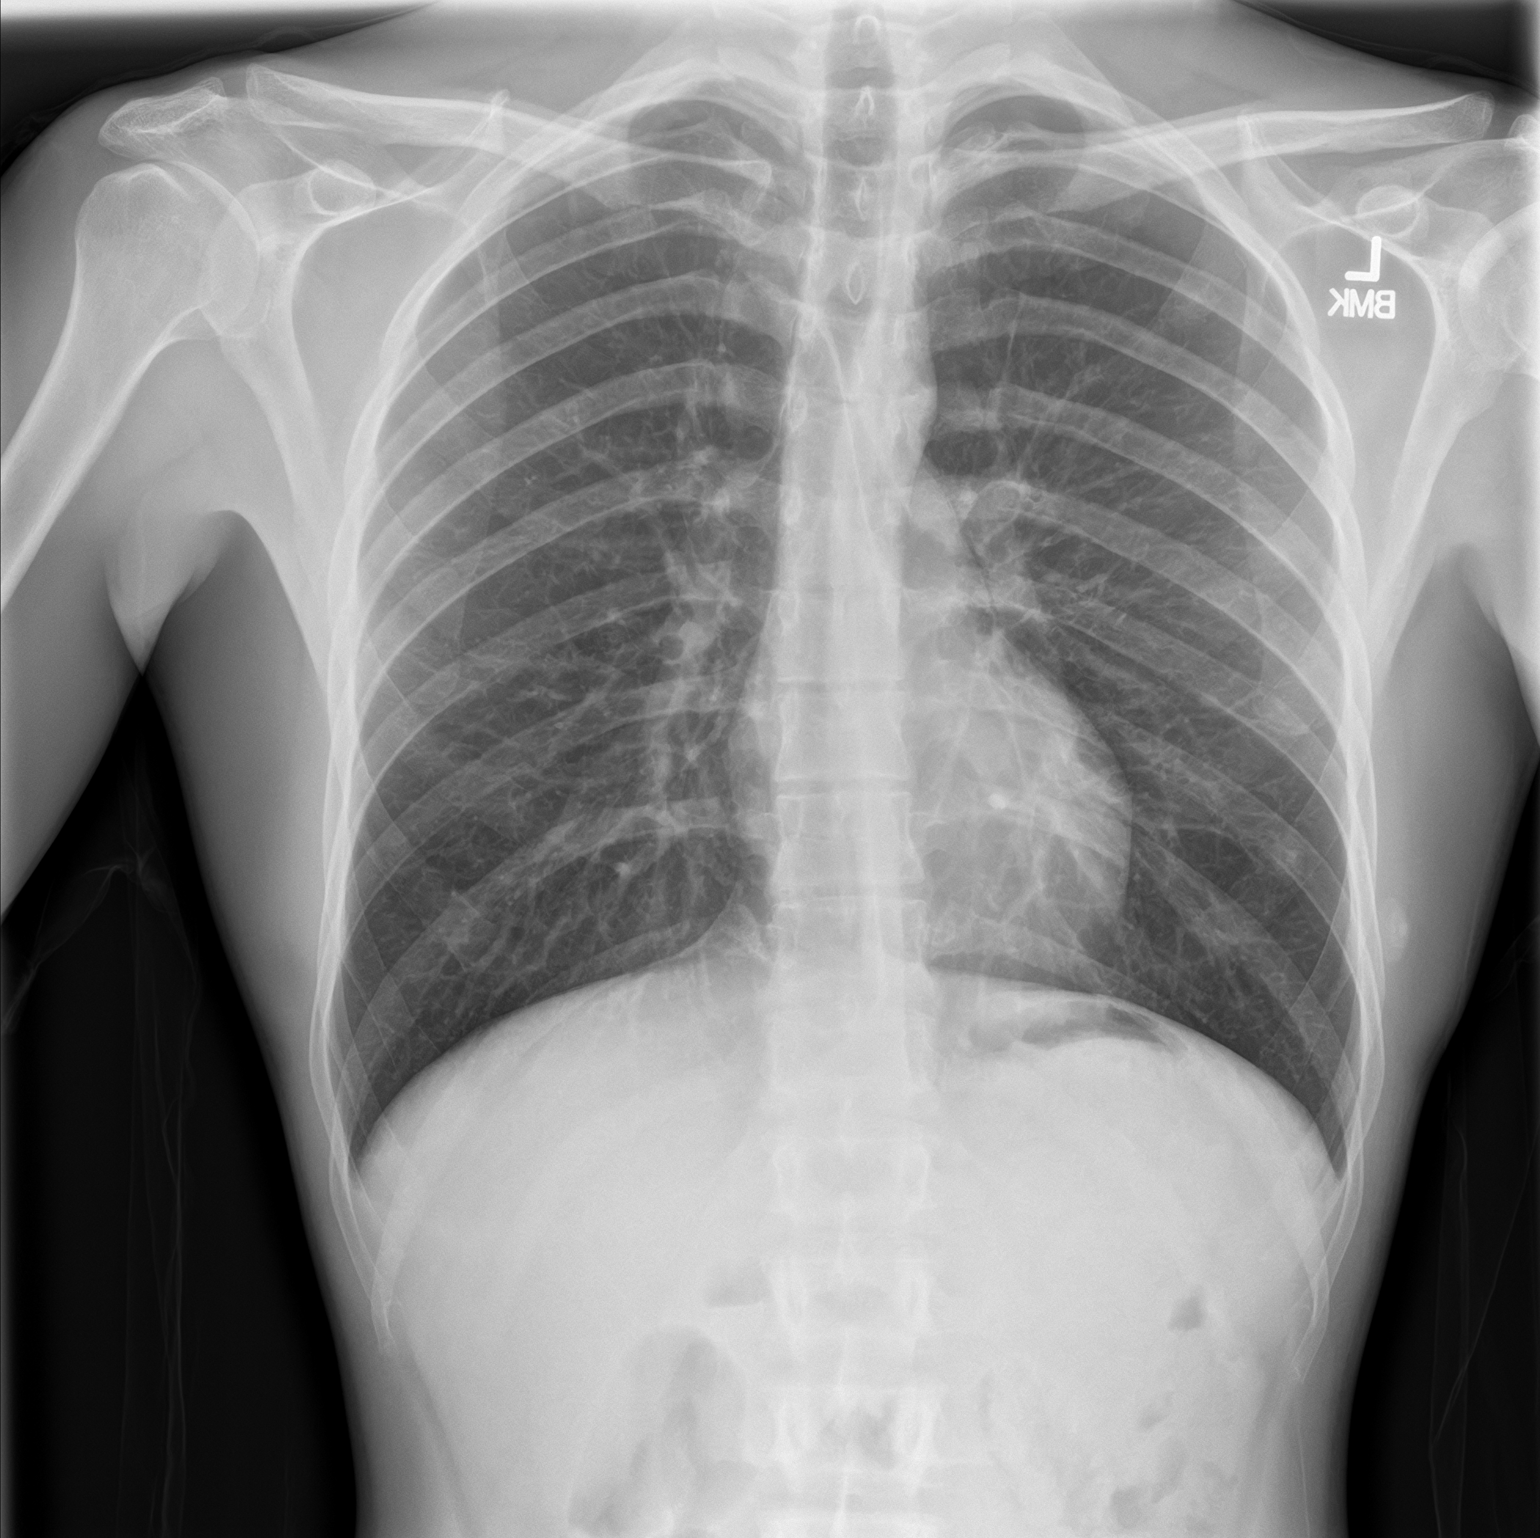
[im 2/2]
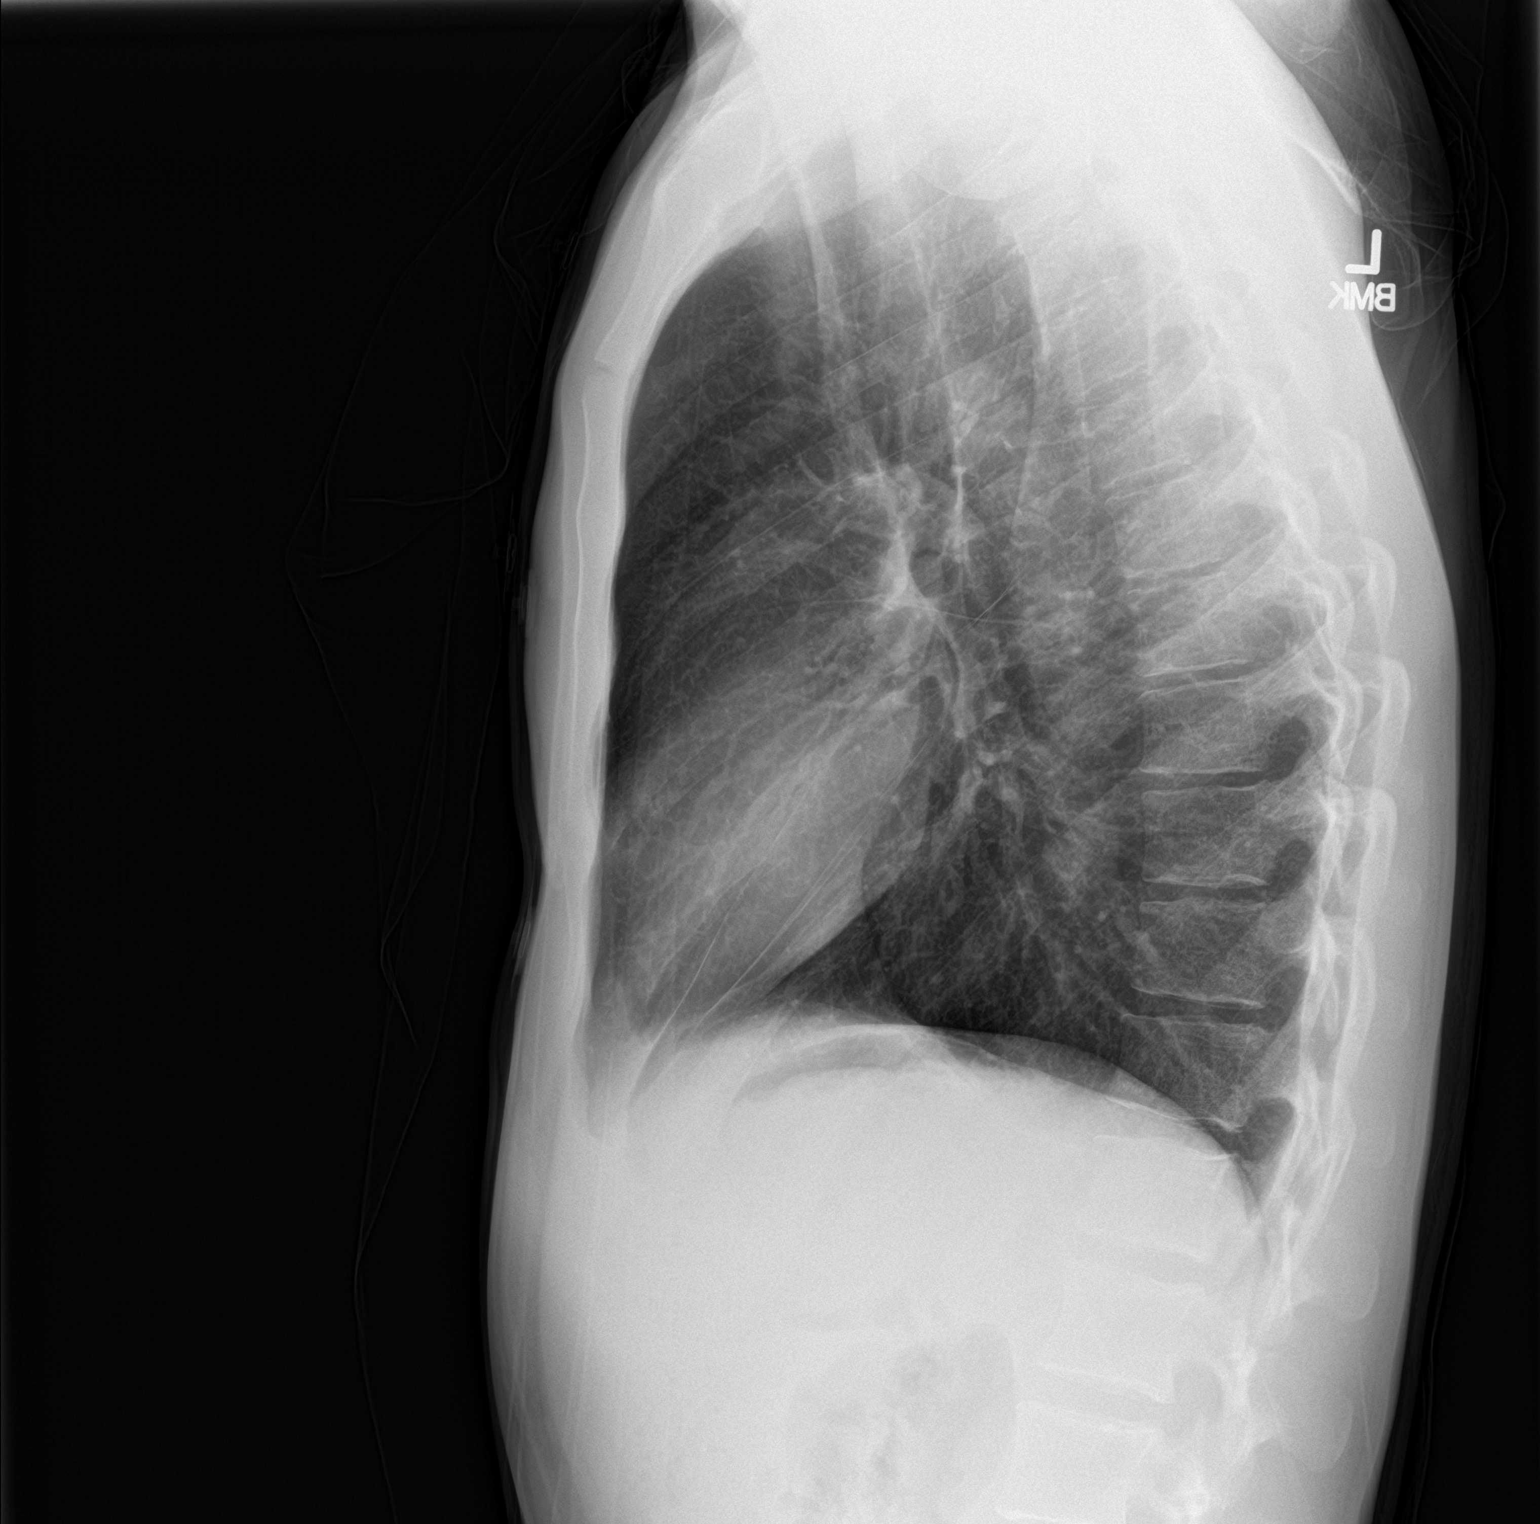

[2 of 2 positions shown; findings below may reference images not displayed]

FINDINGS: No focal consolidation, pleural effusion, or pneumothorax. Faint
linear lucency along the left hilum may be artifactual or represent
a minimal pneumomediastinum. The cardiac silhouette is within
limits. No acute osseous pathology.
IMPRESSION: 1. No active cardiopulmonary disease.
2. Artifact versus trace pneumomediastinum.
